# Patient Record
Sex: Male | Born: 1935 | Race: White | Hispanic: No | Marital: Married | Smoking: Former smoker
Health system: Southern US, Community
[De-identification: ages and names within clinical notes are randomized; demographics above are authoritative.]

## PROBLEM LIST (undated history)

## (undated) DIAGNOSIS — I10 Essential (primary) hypertension: Secondary | ICD-10-CM

## (undated) DIAGNOSIS — F0390 Unspecified dementia without behavioral disturbance: Secondary | ICD-10-CM

## (undated) DIAGNOSIS — E785 Hyperlipidemia, unspecified: Secondary | ICD-10-CM

## (undated) HISTORY — PX: CARDIAC SURGERY: SHX584

---

## 2005-12-30 ENCOUNTER — Encounter (HOSPITAL_COMMUNITY): Admission: RE | Admit: 2005-12-30 | Discharge: 2006-02-14 | Payer: Self-pay | Admitting: Urology

## 2006-03-31 ENCOUNTER — Ambulatory Visit (HOSPITAL_COMMUNITY): Admission: RE | Admit: 2006-03-31 | Discharge: 2006-03-31 | Payer: Self-pay | Admitting: Urology

## 2008-03-06 ENCOUNTER — Ambulatory Visit: Payer: Self-pay | Admitting: Unknown Physician Specialty

## 2011-02-17 ENCOUNTER — Ambulatory Visit: Payer: Self-pay | Admitting: Endocrinology

## 2015-01-27 DIAGNOSIS — E039 Hypothyroidism, unspecified: Secondary | ICD-10-CM | POA: Diagnosis not present

## 2015-01-27 DIAGNOSIS — I10 Essential (primary) hypertension: Secondary | ICD-10-CM | POA: Diagnosis not present

## 2015-01-27 DIAGNOSIS — C44629 Squamous cell carcinoma of skin of left upper limb, including shoulder: Secondary | ICD-10-CM | POA: Diagnosis not present

## 2015-01-27 DIAGNOSIS — E118 Type 2 diabetes mellitus with unspecified complications: Secondary | ICD-10-CM | POA: Diagnosis not present

## 2015-01-27 DIAGNOSIS — E780 Pure hypercholesterolemia: Secondary | ICD-10-CM | POA: Diagnosis not present

## 2015-01-27 DIAGNOSIS — C4499 Other specified malignant neoplasm of skin, unspecified: Secondary | ICD-10-CM | POA: Diagnosis not present

## 2015-05-07 DIAGNOSIS — E780 Pure hypercholesterolemia: Secondary | ICD-10-CM | POA: Diagnosis not present

## 2015-05-07 DIAGNOSIS — G309 Alzheimer's disease, unspecified: Secondary | ICD-10-CM | POA: Diagnosis not present

## 2015-05-07 DIAGNOSIS — E118 Type 2 diabetes mellitus with unspecified complications: Secondary | ICD-10-CM | POA: Diagnosis not present

## 2015-05-14 DIAGNOSIS — C44629 Squamous cell carcinoma of skin of left upper limb, including shoulder: Secondary | ICD-10-CM | POA: Diagnosis not present

## 2015-05-14 DIAGNOSIS — E039 Hypothyroidism, unspecified: Secondary | ICD-10-CM | POA: Diagnosis not present

## 2015-05-14 DIAGNOSIS — C4499 Other specified malignant neoplasm of skin, unspecified: Secondary | ICD-10-CM | POA: Diagnosis not present

## 2015-05-14 DIAGNOSIS — E780 Pure hypercholesterolemia: Secondary | ICD-10-CM | POA: Diagnosis not present

## 2015-05-14 DIAGNOSIS — G309 Alzheimer's disease, unspecified: Secondary | ICD-10-CM | POA: Diagnosis not present

## 2015-05-15 DIAGNOSIS — C44729 Squamous cell carcinoma of skin of left lower limb, including hip: Secondary | ICD-10-CM | POA: Diagnosis not present

## 2015-05-15 DIAGNOSIS — C4499 Other specified malignant neoplasm of skin, unspecified: Secondary | ICD-10-CM | POA: Diagnosis not present

## 2015-06-17 DIAGNOSIS — C4499 Other specified malignant neoplasm of skin, unspecified: Secondary | ICD-10-CM | POA: Diagnosis not present

## 2015-06-17 DIAGNOSIS — E780 Pure hypercholesterolemia: Secondary | ICD-10-CM | POA: Diagnosis not present

## 2015-06-17 DIAGNOSIS — I10 Essential (primary) hypertension: Secondary | ICD-10-CM | POA: Diagnosis not present

## 2015-06-17 DIAGNOSIS — E039 Hypothyroidism, unspecified: Secondary | ICD-10-CM | POA: Diagnosis not present

## 2015-06-17 DIAGNOSIS — C44629 Squamous cell carcinoma of skin of left upper limb, including shoulder: Secondary | ICD-10-CM | POA: Diagnosis not present

## 2015-06-17 DIAGNOSIS — L905 Scar conditions and fibrosis of skin: Secondary | ICD-10-CM | POA: Diagnosis not present

## 2015-06-29 DIAGNOSIS — D075 Carcinoma in situ of prostate: Secondary | ICD-10-CM | POA: Diagnosis not present

## 2015-06-29 DIAGNOSIS — E780 Pure hypercholesterolemia: Secondary | ICD-10-CM | POA: Diagnosis not present

## 2015-06-29 DIAGNOSIS — E039 Hypothyroidism, unspecified: Secondary | ICD-10-CM | POA: Diagnosis not present

## 2015-06-29 DIAGNOSIS — I10 Essential (primary) hypertension: Secondary | ICD-10-CM | POA: Diagnosis not present

## 2015-07-31 DIAGNOSIS — Z1211 Encounter for screening for malignant neoplasm of colon: Secondary | ICD-10-CM | POA: Diagnosis not present

## 2015-07-31 DIAGNOSIS — Z125 Encounter for screening for malignant neoplasm of prostate: Secondary | ICD-10-CM | POA: Diagnosis not present

## 2015-07-31 DIAGNOSIS — Z23 Encounter for immunization: Secondary | ICD-10-CM | POA: Diagnosis not present

## 2015-07-31 DIAGNOSIS — K224 Dyskinesia of esophagus: Secondary | ICD-10-CM | POA: Diagnosis not present

## 2015-07-31 DIAGNOSIS — Z0000 Encounter for general adult medical examination without abnormal findings: Secondary | ICD-10-CM | POA: Diagnosis not present

## 2015-07-31 DIAGNOSIS — G309 Alzheimer's disease, unspecified: Secondary | ICD-10-CM | POA: Diagnosis not present

## 2015-08-07 DIAGNOSIS — E780 Pure hypercholesterolemia: Secondary | ICD-10-CM | POA: Diagnosis not present

## 2015-08-14 DIAGNOSIS — E7800 Pure hypercholesterolemia, unspecified: Secondary | ICD-10-CM | POA: Diagnosis not present

## 2015-08-14 DIAGNOSIS — E118 Type 2 diabetes mellitus with unspecified complications: Secondary | ICD-10-CM | POA: Diagnosis not present

## 2015-08-14 DIAGNOSIS — G309 Alzheimer's disease, unspecified: Secondary | ICD-10-CM | POA: Diagnosis not present

## 2015-08-14 DIAGNOSIS — D075 Carcinoma in situ of prostate: Secondary | ICD-10-CM | POA: Diagnosis not present

## 2015-08-14 DIAGNOSIS — I10 Essential (primary) hypertension: Secondary | ICD-10-CM | POA: Diagnosis not present

## 2015-11-13 DIAGNOSIS — D075 Carcinoma in situ of prostate: Secondary | ICD-10-CM | POA: Diagnosis not present

## 2015-11-13 DIAGNOSIS — G309 Alzheimer's disease, unspecified: Secondary | ICD-10-CM | POA: Diagnosis not present

## 2015-11-13 DIAGNOSIS — E118 Type 2 diabetes mellitus with unspecified complications: Secondary | ICD-10-CM | POA: Diagnosis not present

## 2015-11-13 DIAGNOSIS — E785 Hyperlipidemia, unspecified: Secondary | ICD-10-CM | POA: Diagnosis not present

## 2015-11-13 DIAGNOSIS — E039 Hypothyroidism, unspecified: Secondary | ICD-10-CM | POA: Diagnosis not present

## 2015-11-20 DIAGNOSIS — G309 Alzheimer's disease, unspecified: Secondary | ICD-10-CM | POA: Diagnosis not present

## 2015-11-20 DIAGNOSIS — I2510 Atherosclerotic heart disease of native coronary artery without angina pectoris: Secondary | ICD-10-CM | POA: Diagnosis not present

## 2015-11-20 DIAGNOSIS — I10 Essential (primary) hypertension: Secondary | ICD-10-CM | POA: Diagnosis not present

## 2015-11-20 DIAGNOSIS — E039 Hypothyroidism, unspecified: Secondary | ICD-10-CM | POA: Diagnosis not present

## 2015-12-03 DIAGNOSIS — I2510 Atherosclerotic heart disease of native coronary artery without angina pectoris: Secondary | ICD-10-CM | POA: Diagnosis not present

## 2015-12-03 DIAGNOSIS — L570 Actinic keratosis: Secondary | ICD-10-CM | POA: Diagnosis not present

## 2015-12-03 DIAGNOSIS — C4499 Other specified malignant neoplasm of skin, unspecified: Secondary | ICD-10-CM | POA: Diagnosis not present

## 2015-12-03 DIAGNOSIS — G309 Alzheimer's disease, unspecified: Secondary | ICD-10-CM | POA: Diagnosis not present

## 2015-12-03 DIAGNOSIS — D2321 Other benign neoplasm of skin of right ear and external auricular canal: Secondary | ICD-10-CM | POA: Diagnosis not present

## 2015-12-03 DIAGNOSIS — E039 Hypothyroidism, unspecified: Secondary | ICD-10-CM | POA: Diagnosis not present

## 2015-12-10 DIAGNOSIS — G309 Alzheimer's disease, unspecified: Secondary | ICD-10-CM | POA: Diagnosis not present

## 2015-12-10 DIAGNOSIS — I2510 Atherosclerotic heart disease of native coronary artery without angina pectoris: Secondary | ICD-10-CM | POA: Diagnosis not present

## 2015-12-10 DIAGNOSIS — D075 Carcinoma in situ of prostate: Secondary | ICD-10-CM | POA: Diagnosis not present

## 2015-12-10 DIAGNOSIS — E039 Hypothyroidism, unspecified: Secondary | ICD-10-CM | POA: Diagnosis not present

## 2016-03-01 DIAGNOSIS — G309 Alzheimer's disease, unspecified: Secondary | ICD-10-CM | POA: Diagnosis not present

## 2016-03-01 DIAGNOSIS — I2510 Atherosclerotic heart disease of native coronary artery without angina pectoris: Secondary | ICD-10-CM | POA: Diagnosis not present

## 2016-03-01 DIAGNOSIS — E039 Hypothyroidism, unspecified: Secondary | ICD-10-CM | POA: Diagnosis not present

## 2016-03-01 DIAGNOSIS — D075 Carcinoma in situ of prostate: Secondary | ICD-10-CM | POA: Diagnosis not present

## 2016-03-01 DIAGNOSIS — M150 Primary generalized (osteo)arthritis: Secondary | ICD-10-CM | POA: Diagnosis not present

## 2016-03-30 DIAGNOSIS — I2510 Atherosclerotic heart disease of native coronary artery without angina pectoris: Secondary | ICD-10-CM | POA: Diagnosis not present

## 2016-03-30 DIAGNOSIS — G309 Alzheimer's disease, unspecified: Secondary | ICD-10-CM | POA: Diagnosis not present

## 2016-03-30 DIAGNOSIS — E1165 Type 2 diabetes mellitus with hyperglycemia: Secondary | ICD-10-CM | POA: Diagnosis not present

## 2016-03-30 DIAGNOSIS — D075 Carcinoma in situ of prostate: Secondary | ICD-10-CM | POA: Diagnosis not present

## 2016-03-30 DIAGNOSIS — I10 Essential (primary) hypertension: Secondary | ICD-10-CM | POA: Diagnosis not present

## 2016-03-30 DIAGNOSIS — M150 Primary generalized (osteo)arthritis: Secondary | ICD-10-CM | POA: Diagnosis not present

## 2016-05-06 DIAGNOSIS — C44222 Squamous cell carcinoma of skin of right ear and external auricular canal: Secondary | ICD-10-CM | POA: Diagnosis not present

## 2016-05-06 DIAGNOSIS — D075 Carcinoma in situ of prostate: Secondary | ICD-10-CM | POA: Diagnosis not present

## 2016-05-06 DIAGNOSIS — I4519 Other right bundle-branch block: Secondary | ICD-10-CM | POA: Diagnosis not present

## 2016-05-06 DIAGNOSIS — C4499 Other specified malignant neoplasm of skin, unspecified: Secondary | ICD-10-CM | POA: Diagnosis not present

## 2016-05-06 DIAGNOSIS — C44212 Basal cell carcinoma of skin of right ear and external auricular canal: Secondary | ICD-10-CM | POA: Diagnosis not present

## 2016-05-24 DIAGNOSIS — C4499 Other specified malignant neoplasm of skin, unspecified: Secondary | ICD-10-CM | POA: Diagnosis not present

## 2016-05-24 DIAGNOSIS — E039 Hypothyroidism, unspecified: Secondary | ICD-10-CM | POA: Diagnosis not present

## 2016-05-24 DIAGNOSIS — I2510 Atherosclerotic heart disease of native coronary artery without angina pectoris: Secondary | ICD-10-CM | POA: Diagnosis not present

## 2016-05-24 DIAGNOSIS — D075 Carcinoma in situ of prostate: Secondary | ICD-10-CM | POA: Diagnosis not present

## 2016-05-24 DIAGNOSIS — I4519 Other right bundle-branch block: Secondary | ICD-10-CM | POA: Diagnosis not present

## 2016-07-22 DIAGNOSIS — E039 Hypothyroidism, unspecified: Secondary | ICD-10-CM | POA: Diagnosis not present

## 2016-07-22 DIAGNOSIS — Z23 Encounter for immunization: Secondary | ICD-10-CM | POA: Diagnosis not present

## 2016-07-22 DIAGNOSIS — E118 Type 2 diabetes mellitus with unspecified complications: Secondary | ICD-10-CM | POA: Diagnosis not present

## 2016-07-22 DIAGNOSIS — I4519 Other right bundle-branch block: Secondary | ICD-10-CM | POA: Diagnosis not present

## 2016-07-22 DIAGNOSIS — M150 Primary generalized (osteo)arthritis: Secondary | ICD-10-CM | POA: Diagnosis not present

## 2016-07-22 DIAGNOSIS — I2510 Atherosclerotic heart disease of native coronary artery without angina pectoris: Secondary | ICD-10-CM | POA: Diagnosis not present

## 2016-07-31 ENCOUNTER — Emergency Department
Admission: EM | Admit: 2016-07-31 | Discharge: 2016-07-31 | Disposition: A | Discharge status: Home / Self Care | Payer: Medicare Other | Attending: Emergency Medicine | Admitting: Emergency Medicine

## 2016-07-31 DIAGNOSIS — Z87891 Personal history of nicotine dependence: Secondary | ICD-10-CM | POA: Insufficient documentation

## 2016-07-31 DIAGNOSIS — I10 Essential (primary) hypertension: Secondary | ICD-10-CM | POA: Diagnosis not present

## 2016-07-31 DIAGNOSIS — B029 Zoster without complications: Secondary | ICD-10-CM | POA: Insufficient documentation

## 2016-07-31 DIAGNOSIS — R21 Rash and other nonspecific skin eruption: Secondary | ICD-10-CM | POA: Diagnosis present

## 2016-07-31 LAB — BASIC METABOLIC PANEL
ANION GAP: 7 (ref 5–15)
BUN: 20 mg/dL (ref 6–20)
CHLORIDE: 101 mmol/L (ref 101–111)
CO2: 26 mmol/L (ref 22–32)
Calcium: 9.3 mg/dL (ref 8.9–10.3)
Creatinine, Ser: 0.97 mg/dL (ref 0.61–1.24)
GFR calc Af Amer: >60 mL/min (ref >60)
GLUCOSE: 189 mg/dL — AB (ref 65–99)
POTASSIUM: 3.6 mmol/L (ref 3.5–5.1)
Sodium: 134 mmol/L — ABNORMAL LOW (ref 135–145)

## 2016-07-31 LAB — CBC WITH DIFFERENTIAL/PLATELET
BASOS ABS: 0 10*3/uL (ref 0–0.1)
Basophils Relative: 1 %
EOS PCT: 0 %
Eosinophils Absolute: 0 10*3/uL (ref 0–0.7)
HCT: 40.4 % (ref 40.0–52.0)
HEMOGLOBIN: 14 g/dL (ref 13.0–18.0)
LYMPHS ABS: 0.5 10*3/uL — AB (ref 1.0–3.6)
LYMPHS PCT: 6 %
MCH: 32.5 pg (ref 26.0–34.0)
MCHC: 34.5 g/dL (ref 32.0–36.0)
MCV: 94 fL (ref 80.0–100.0)
Monocytes Absolute: 0.8 10*3/uL (ref 0.2–1.0)
Monocytes Relative: 9 %
NEUTROS ABS: 7.5 10*3/uL — AB (ref 1.4–6.5)
NEUTROS PCT: 84 %
PLATELETS: 216 10*3/uL (ref 150–440)
RBC: 4.3 MIL/uL — AB (ref 4.40–5.90)
RDW: 13.4 % (ref 11.5–14.5)
WBC: 8.9 10*3/uL (ref 3.8–10.6)

## 2016-07-31 MED ORDER — VALACYCLOVIR HCL 500 MG PO TABS
1000.0000 mg | ORAL_TABLET | Freq: Once | ORAL | Status: AC
  Administered 2016-07-31: 1000 mg via ORAL

## 2016-07-31 MED ORDER — CEPHALEXIN 500 MG PO CAPS
500.0000 mg | ORAL_CAPSULE | Freq: Once | ORAL | Status: AC
  Administered 2016-07-31: 500 mg via ORAL

## 2016-07-31 MED ORDER — TRIFLURIDINE 1 % OP SOLN: 1.0000 [drp] | OPHTHALMIC | 0 refills | Status: AC

## 2016-07-31 MED ORDER — VALACYCLOVIR HCL 500 MG PO TABS
ORAL_TABLET | ORAL | Status: AC
  Administered 2016-07-31: 1000 mg via ORAL
  Filled 2016-07-31: qty 2

## 2016-07-31 MED ORDER — CEPHALEXIN 250 MG PO CAPS: 250.0000 mg | ORAL_CAPSULE | Freq: Four times a day (QID) | ORAL | 0 refills | Status: DC

## 2016-07-31 MED ORDER — TRAMADOL HCL 50 MG PO TABS: 50.0000 mg | ORAL_TABLET | Freq: Four times a day (QID) | ORAL | 0 refills | Status: DC | PRN

## 2016-07-31 MED ORDER — CEPHALEXIN 500 MG PO CAPS
ORAL_CAPSULE | ORAL | Status: AC
  Administered 2016-07-31: 500 mg via ORAL
  Filled 2016-07-31: qty 1

## 2016-07-31 MED ORDER — PREDNISONE 10 MG (21) PO TBPK: ORAL_TABLET | ORAL | 0 refills | Status: DC

## 2016-07-31 MED ORDER — OXYCODONE-ACETAMINOPHEN 5-325 MG PO TABS
1.0000 | ORAL_TABLET | Freq: Once | ORAL | Status: AC
  Administered 2016-07-31: 1 via ORAL

## 2016-07-31 MED ORDER — OXYCODONE-ACETAMINOPHEN 5-325 MG PO TABS
ORAL_TABLET | ORAL | Status: AC
  Administered 2016-07-31: 1 via ORAL
  Filled 2016-07-31: qty 1

## 2016-07-31 MED ORDER — VALACYCLOVIR HCL 1 G PO TABS: 1000.0000 mg | ORAL_TABLET | Freq: Three times a day (TID) | ORAL | 0 refills | Status: DC

## 2016-07-31 NOTE — ED Notes (Signed)
Around pts eye filled with fluid and swollen. Redness noted and pain reported by pt. Discharge in eye and crusted around eye lashes. Redness stretching back right side of pts face and head.

## 2016-07-31 NOTE — ED Provider Notes (Signed)
[facility] Emergency Department Provider Note        Time seen: ----------------------------------------- 10:41 PM on 07/31/2016 -----------------------------------------    I have reviewed the triage vital signs and the nursing notes.   HISTORY  Chief Complaint Headache    HPI Joshua Zamora is a 82 y.o. male who presents to ER for possible fall. Family reported redness to the forehead and swelling around the right eye and concern he may have fallen. Patient reports the area is tender to touch. Nursing home did not report any falls. Patient complains of pain to his forehead otherwise denies complaints.   No past medical history on file.  There are no active problems to display for this patient.   No past surgical history on file.  Allergies Review of patient's allergies indicates no known allergies.  Social History Social History  Substance Use Topics  . Smoking status: Not on file  . Smokeless tobacco: Not on file  . Alcohol use Not on file    Review of Systems Constitutional: Negative for fever. ENT: Positive for right periorbital swelling and erythema Cardiovascular: Negative for chest pain. Respiratory: Negative for shortness of breath. Gastrointestinal: Negative for abdominal pain, vomiting and diarrhea. Skin: Positive for right forehead rash Neurological: Negative for headaches, focal weakness or numbness.  10-point ROS otherwise negative.  ____________________________________________   PHYSICAL EXAM:  VITAL SIGNS: ED Triage Vitals [07/31/16 2055]  Enc Vitals Group     BP (!) 165/70     Pulse Rate 73     Resp 20     Temp 98.2 F (36.8 C)     Temp Source Oral     SpO2 98 %     Weight 140 lb (63.5 kg)     Height 5\' 10"  (1.778 m)     Head Circumference      Peak Flow      Pain Score      Pain Loc      Pain Edu?      Excl. in [place]?     Constitutional: Alert, No acute distress Eyes: Conjunctivae on the right are  injected. PERRL. Normal extraocular movements. Eye was examined using fluorescein and tetracaine without any dendritic pattern or corneal ulcer present. ENT   Head: Erythema and rash in a specific V1 distribution on the right   Nose: No congestion/rhinnorhea.   Mouth/Throat: Mucous membranes are moist.   Neck: No stridor. Cardiovascular: Normal rate, regular rhythm. No murmurs, rubs, or gallops. Respiratory: Normal respiratory effort without tachypnea nor retractions. Breath sounds are clear and equal bilaterally. No wheezes/rales/rhonchi. Musculoskeletal: Nontender with normal range of motion in all extremities. No lower extremity tenderness nor edema. Neurologic:  Normal speech and language. No gross focal neurologic deficits are appreciated.  Skin:  Right-sided V1 trigeminal nerve rash as dictated above. Psychiatric: Mood and affect are normal. Speech and behavior are normal.  ____________________________________________  ED COURSE:  Pertinent labs & imaging results that were available during my care of the patient were reviewed by me and considered in my medical decision making (see chart for details). Clinical Course  Patient with findings consistent with shingles. He will likely require oral and ophthalmic medications as he has conjunctival injection on the right. Fluorescein and tetracaine exam is normal.  Procedures ____________________________________________   LABS (pertinent positives/negatives)  Labs Reviewed  CBC WITH DIFFERENTIAL/PLATELET - Abnormal; Notable for the following:       Result Value   RBC 4.30 (*)    Neutro  Abs 7.5 (*)    Lymphs Abs 0.5 (*)    All other components within normal limits  BASIC METABOLIC PANEL - Abnormal; Notable for the following:    Sodium 134 (*)    Glucose, Bld 189 (*)    All other components within normal limits    ____________________________________________  FINAL ASSESSMENT AND PLAN  Shingles  Plan: Patient with  labs as dictated above. Blood work is less consistent with cellulitis and more consistent with shingles. He will be treated with oral medication and steroids as well as ophthalmic drops. I may add Keflex to cover for any cellulitis that is present. He is advised to have follow-up with ophthalmology in 2 days for recheck.   Earleen Newport, MD   Note: This dictation was prepared with Dragon dictation. Any transcriptional errors that result from this process are unintentional    Earleen Newport, MD 07/31/16 2248

## 2016-07-31 NOTE — ED Notes (Signed)

## 2016-07-31 NOTE — ED Triage Notes (Addendum)
Daughter brought patient because concerned about possible fall.  Reports noticed redness (rash) to right forehead and right eye and concerned might have fallen.  Reports tender to touch.

## 2016-08-01 DIAGNOSIS — E039 Hypothyroidism, unspecified: Secondary | ICD-10-CM | POA: Diagnosis not present

## 2016-08-01 DIAGNOSIS — B028 Zoster with other complications: Secondary | ICD-10-CM | POA: Diagnosis not present

## 2016-08-01 DIAGNOSIS — I4519 Other right bundle-branch block: Secondary | ICD-10-CM | POA: Diagnosis not present

## 2016-08-03 ENCOUNTER — Emergency Department: Payer: Medicare Other

## 2016-08-03 ENCOUNTER — Observation Stay
Admission: EM | Admit: 2016-08-03 | Discharge: 2016-08-08 | Disposition: A | Discharge status: Skilled Nursing Facility | Payer: Medicare Other | Attending: Internal Medicine | Admitting: Internal Medicine

## 2016-08-03 ENCOUNTER — Encounter: Payer: Self-pay | Admitting: Emergency Medicine

## 2016-08-03 DIAGNOSIS — E039 Hypothyroidism, unspecified: Secondary | ICD-10-CM | POA: Insufficient documentation

## 2016-08-03 DIAGNOSIS — R9431 Abnormal electrocardiogram [ECG] [EKG]: Secondary | ICD-10-CM | POA: Diagnosis not present

## 2016-08-03 DIAGNOSIS — I2510 Atherosclerotic heart disease of native coronary artery without angina pectoris: Secondary | ICD-10-CM | POA: Insufficient documentation

## 2016-08-03 DIAGNOSIS — R630 Anorexia: Secondary | ICD-10-CM | POA: Insufficient documentation

## 2016-08-03 DIAGNOSIS — K5900 Constipation, unspecified: Secondary | ICD-10-CM | POA: Diagnosis not present

## 2016-08-03 DIAGNOSIS — E876 Hypokalemia: Secondary | ICD-10-CM | POA: Insufficient documentation

## 2016-08-03 DIAGNOSIS — R930 Abnormal findings on diagnostic imaging of skull and head, not elsewhere classified: Secondary | ICD-10-CM | POA: Diagnosis not present

## 2016-08-03 DIAGNOSIS — M6281 Muscle weakness (generalized): Secondary | ICD-10-CM

## 2016-08-03 DIAGNOSIS — Z79899 Other long term (current) drug therapy: Secondary | ICD-10-CM | POA: Insufficient documentation

## 2016-08-03 DIAGNOSIS — Z87891 Personal history of nicotine dependence: Secondary | ICD-10-CM | POA: Diagnosis not present

## 2016-08-03 DIAGNOSIS — I10 Essential (primary) hypertension: Secondary | ICD-10-CM | POA: Diagnosis not present

## 2016-08-03 DIAGNOSIS — G92 Toxic encephalopathy: Principal | ICD-10-CM | POA: Insufficient documentation

## 2016-08-03 DIAGNOSIS — B029 Zoster without complications: Secondary | ICD-10-CM

## 2016-08-03 DIAGNOSIS — R627 Adult failure to thrive: Secondary | ICD-10-CM | POA: Insufficient documentation

## 2016-08-03 DIAGNOSIS — R262 Difficulty in walking, not elsewhere classified: Secondary | ICD-10-CM | POA: Diagnosis not present

## 2016-08-03 DIAGNOSIS — F0391 Unspecified dementia with behavioral disturbance: Secondary | ICD-10-CM | POA: Diagnosis not present

## 2016-08-03 DIAGNOSIS — R531 Weakness: Secondary | ICD-10-CM

## 2016-08-03 DIAGNOSIS — E871 Hypo-osmolality and hyponatremia: Secondary | ICD-10-CM | POA: Diagnosis not present

## 2016-08-03 DIAGNOSIS — T424X5A Adverse effect of benzodiazepines, initial encounter: Secondary | ICD-10-CM | POA: Insufficient documentation

## 2016-08-03 DIAGNOSIS — E785 Hyperlipidemia, unspecified: Secondary | ICD-10-CM | POA: Diagnosis not present

## 2016-08-03 HISTORY — DX: Unspecified dementia, unspecified severity, without behavioral disturbance, psychotic disturbance, mood disturbance, and anxiety: F03.90

## 2016-08-03 HISTORY — DX: Hyperlipidemia, unspecified: E78.5

## 2016-08-03 HISTORY — DX: Essential (primary) hypertension: I10

## 2016-08-03 LAB — URINALYSIS COMPLETE WITH MICROSCOPIC (ARMC ONLY)
BILIRUBIN URINE: NEGATIVE
Bacteria, UA: NONE SEEN
GLUCOSE, UA: NEGATIVE mg/dL
HGB URINE DIPSTICK: NEGATIVE
KETONES UR: NEGATIVE mg/dL
LEUKOCYTES UA: NEGATIVE
NITRITE: NEGATIVE
PH: 7 (ref 5.0–8.0)
Protein, ur: NEGATIVE mg/dL
SPECIFIC GRAVITY, URINE: 1.006 (ref 1.005–1.030)
Squamous Epithelial / LPF: NONE SEEN

## 2016-08-03 LAB — BASIC METABOLIC PANEL
Anion gap: 6 (ref 5–15)
BUN: 22 mg/dL — AB (ref 6–20)
CHLORIDE: 96 mmol/L — AB (ref 101–111)
CO2: 27 mmol/L (ref 22–32)
CREATININE: 0.87 mg/dL (ref 0.61–1.24)
Calcium: 9.1 mg/dL (ref 8.9–10.3)
Glucose, Bld: 112 mg/dL — ABNORMAL HIGH (ref 65–99)
POTASSIUM: 4.3 mmol/L (ref 3.5–5.1)
SODIUM: 129 mmol/L — AB (ref 135–145)

## 2016-08-03 LAB — CBC
HCT: 41.7 % (ref 40.0–52.0)
Hemoglobin: 14.4 g/dL (ref 13.0–18.0)
MCH: 32.2 pg (ref 26.0–34.0)
MCHC: 34.5 g/dL (ref 32.0–36.0)
MCV: 93.2 fL (ref 80.0–100.0)
PLATELETS: 240 10*3/uL (ref 150–440)
RBC: 4.47 MIL/uL (ref 4.40–5.90)
RDW: 13.5 % (ref 11.5–14.5)
WBC: 11.4 10*3/uL — AB (ref 3.8–10.6)

## 2016-08-03 LAB — TROPONIN I: Troponin I: <0.03 ng/mL (ref <0.03)

## 2016-08-03 MED ORDER — ZOLPIDEM TARTRATE 5 MG PO TABS: 5.0000 mg | ORAL_TABLET | Freq: Every evening | ORAL | Status: DC | PRN

## 2016-08-03 MED ORDER — SENNOSIDES-DOCUSATE SODIUM 8.6-50 MG PO TABS: 1.0000 | ORAL_TABLET | Freq: Every evening | ORAL | Status: DC | PRN

## 2016-08-03 MED ORDER — ONDANSETRON HCL 4 MG/2ML IJ SOLN: 4.0000 mg | Freq: Four times a day (QID) | INTRAMUSCULAR | Status: DC | PRN

## 2016-08-03 MED ORDER — ACETAMINOPHEN 325 MG PO TABS: 650.0000 mg | ORAL_TABLET | Freq: Four times a day (QID) | ORAL | Status: DC | PRN

## 2016-08-03 MED ORDER — SODIUM CHLORIDE 0.9 % IV SOLN
INTRAVENOUS | Status: DC
Start: 2016-08-03 — End: 2016-08-06
  Administered 2016-08-03: 75 mL/h via INTRAVENOUS
  Administered 2016-08-04 – 2016-08-05 (×3): via INTRAVENOUS
  Administered 2016-08-06: 75 mL/h via INTRAVENOUS

## 2016-08-03 MED ORDER — HYDROCODONE-ACETAMINOPHEN 5-325 MG PO TABS: 1.0000 | ORAL_TABLET | ORAL | Status: DC | PRN

## 2016-08-03 MED ORDER — TRIFLURIDINE 1 % OP SOLN
1.0000 [drp] | OPHTHALMIC | Status: DC
  Administered 2016-08-04 – 2016-08-08 (×27): 1 [drp] via OPHTHALMIC
  Filled 2016-08-03: qty 7.5

## 2016-08-03 MED ORDER — TRAMADOL HCL 50 MG PO TABS
50.0000 mg | ORAL_TABLET | Freq: Four times a day (QID) | ORAL | Status: DC | PRN
  Administered 2016-08-07: 50 mg via ORAL
  Filled 2016-08-03: qty 1

## 2016-08-03 MED ORDER — SODIUM CHLORIDE 0.9 % IV BOLUS (SEPSIS)
1000.0000 mL | Freq: Once | INTRAVENOUS | Status: AC
  Administered 2016-08-03: 1000 mL via INTRAVENOUS

## 2016-08-03 MED ORDER — VALACYCLOVIR HCL 500 MG PO TABS
1000.0000 mg | ORAL_TABLET | Freq: Three times a day (TID) | ORAL | Status: DC
  Administered 2016-08-04 – 2016-08-06 (×7): 1000 mg via ORAL
  Filled 2016-08-03 (×7): qty 2

## 2016-08-03 MED ORDER — ONDANSETRON HCL 4 MG PO TABS: 4.0000 mg | ORAL_TABLET | Freq: Four times a day (QID) | ORAL | Status: DC | PRN

## 2016-08-03 MED ORDER — MAGNESIUM CITRATE PO SOLN: 1.0000 | Freq: Once | ORAL | Status: DC | PRN

## 2016-08-03 MED ORDER — ENOXAPARIN SODIUM 40 MG/0.4ML ~~LOC~~ SOLN
40.0000 mg | Freq: Every day | SUBCUTANEOUS | Status: DC
  Administered 2016-08-04 – 2016-08-07 (×5): 40 mg via SUBCUTANEOUS
  Filled 2016-08-03 (×4): qty 0.4

## 2016-08-03 MED ORDER — BISACODYL 5 MG PO TBEC: 5.0000 mg | DELAYED_RELEASE_TABLET | Freq: Every day | ORAL | Status: DC | PRN

## 2016-08-03 MED ORDER — ACETAMINOPHEN 650 MG RE SUPP: 650.0000 mg | Freq: Four times a day (QID) | RECTAL | Status: DC | PRN

## 2016-08-03 MED ORDER — CEPHALEXIN 250 MG PO CAPS
250.0000 mg | ORAL_CAPSULE | Freq: Four times a day (QID) | ORAL | Status: DC
  Administered 2016-08-04 – 2016-08-05 (×6): 250 mg via ORAL
  Filled 2016-08-03 (×8): qty 1

## 2016-08-03 NOTE — ED Notes (Signed)
Patient transported to CT 

## 2016-08-03 NOTE — Progress Notes (Signed)
Pt was diagnosed with shingles last Sunday and sent home with antiviral medicines. Per report from ED nurse Charleen Kirks, pt has rashes on the chest and lower back. Confirmed with charge RN Olivia Mackie if pt needs to be on airborne precaution and be placed in a negative pressure room. Information relayed to [facility]. No need for pt to be on airborne precaution and negative pressure room per [facility]. Pt was received on the floor on contact precautions.

## 2016-08-03 NOTE — ED Triage Notes (Signed)
Per daughter pt was seen here on Sunday and treated for shingles, sent home on meds. Pt not eating  And daughter states is very weak to the point where he  Would not walk today.

## 2016-08-03 NOTE — ED Provider Notes (Signed)
[facility] Emergency Department Provider Note        Time seen: ----------------------------------------- 5:32 PM on 08/03/2016 -----------------------------------------    I have reviewed the triage vital signs and the nursing notes.   HISTORY  Chief Complaint Fatigue and Anorexia    HPI Joshua Zamora is a 82 y.o. male who presents the ER after he was recently treated for shingles. Patient was sent home on medications covering for shingles including antiviral medicine, steroids and eyedrops. Since discharge here he's not been eating or drinking very well. Daughter states he has been very weak to the point where he has difficulty walking. She states the skin looks improved, he has not had fevers chills or other complaints. Patient states "I don't feel too bad". Family is concerned about progressive weakness.   Past Medical History:  Diagnosis Date  . Dementia   . Hyperlipidemia   . Hypertension     There are no active problems to display for this patient.   Past Surgical History:  Procedure Laterality Date  . CARDIAC SURGERY      Allergies Review of patient's allergies indicates no known allergies.  Social History Social History  Substance Use Topics  . Smoking status: Former Research scientist (life sciences)  . Smokeless tobacco: Never Used  . Alcohol use Not on file    Review of Systems Constitutional: Negative for fever. Cardiovascular: Negative for chest pain. Respiratory: Negative for shortness of breath. Gastrointestinal: Negative for abdominal pain, vomiting and diarrhea. Genitourinary: Negative for dysuria. Musculoskeletal: Negative for back pain. Skin: Positive for right-sided rash on forehead. Neurological: Negative for headaches, Positive for generalized weakness  10-point ROS otherwise negative.  ____________________________________________   PHYSICAL EXAM:  VITAL SIGNS: ED Triage Vitals [08/03/16 1643]  Enc Vitals Group     BP 139/74      Pulse Rate 81     Resp 18     Temp 98.6 F (37 C)     Temp Source Oral     SpO2 96 %     Weight      Height      Head Circumference      Peak Flow      Pain Score      Pain Loc      Pain Edu?      Excl. in [place]?     Constitutional: Alert and oriented. Well appearing and in no distress. Eyes: Conjunctivae are normal. PERRL. Normal extraocular movements. ENT   Head: Normocephalic and atraumatic.Mild right supraorbital edema   Nose: No congestion/rhinnorhea.   Mouth/Throat: Mucous membranes are moist.   Neck: No stridor. Cardiovascular: Normal rate, regular rhythm. No murmurs, rubs, or gallops. Respiratory: Normal respiratory effort without tachypnea nor retractions. Breath sounds are clear and equal bilaterally. No wheezes/rales/rhonchi. Gastrointestinal: Soft and nontender. Normal bowel sounds Musculoskeletal: Nontender with normal range of motion in all extremities. No lower extremity tenderness nor edema. Neurologic:  Normal speech and language. No gross focal neurologic deficits are appreciated. Generalized weakness, nothing focal Skin:  Erythema in a V1 distribution particularly in the right frontal scalp Psychiatric: Mood and affect are normal. Speech and behavior are normal.  ____________________________________________  EKG: Interpreted by me. Sinus rhythm with a rate of 70 bpm, normal PR interval, wide QRS, normal QT interval, right bundle branch block, left axis deviation  ____________________________________________  ED COURSE:  Pertinent labs & imaging results that were available during my care of the patient were reviewed by me and considered in my medical decision making (  see chart for details). Clinical Course  Patient presents to ER for progressive weakness of uncertain etiology. We will assess with basic labs and give IV fluid.  Procedures ____________________________________________   LABS (pertinent positives/negatives)  Labs Reviewed   BASIC METABOLIC PANEL - Abnormal; Notable for the following:       Result Value   Sodium 129 (*)    Chloride 96 (*)    Glucose, Bld 112 (*)    BUN 22 (*)    All other components within normal limits  CBC - Abnormal; Notable for the following:    WBC 11.4 (*)    All other components within normal limits  TROPONIN I  URINALYSIS COMPLETEWITH MICROSCOPIC ([facility] ONLY)  CBG MONITORING, ED    RADIOLOGY Images were viewed by me  IMPRESSION: 1. No acute intracranial abnormalities. 2. Age related volume loss. Moderate chronic microvascular ischemic change. Chest x-ray is unremarkable  ____________________________________________  FINAL ASSESSMENT AND PLAN  Weakness, Hyponatremia  Plan: Patient with labs and imaging as dictated above. No clear etiology for his symptoms. He has been treated for shingles, it is unclear if the medications have caused his weakness. He is generally weak and has had poor by mouth intake. He is receiving IV fluids, family is requesting hospital observation.   Earleen Newport, MD   Note: This dictation was prepared with Dragon dictation. Any transcriptional errors that result from this process are unintentional    Earleen Newport, MD 08/03/16 [phone]

## 2016-08-03 NOTE — ED Notes (Signed)
Report given to Kuch, RN.  

## 2016-08-03 NOTE — ED Notes (Addendum)
Pt stating, " I dont feel to bad." Per pt's family though pt has become decreasingly weak since he was last seen on Sunday for shingles dx. Pt appears sleepy at this time. Pt was a heavy assist from wheelchair to stretcher. Pt not able to support body weight. Per pt's family pt's shingles have improved. Pt had episodes of n/v on Monday and Tuesday and has had a decreased appetite since then.

## 2016-08-03 NOTE — ED Notes (Signed)
Pt has not been able to void yet. Pt is attempting now. Pt is much more awake at this time. Pt has received almost 1L of Ns. Fluids are running good into IV site. Pt's family informed that pt would be going for CT scan.

## 2016-08-04 DIAGNOSIS — R627 Adult failure to thrive: Secondary | ICD-10-CM | POA: Diagnosis not present

## 2016-08-04 DIAGNOSIS — E871 Hypo-osmolality and hyponatremia: Secondary | ICD-10-CM | POA: Diagnosis not present

## 2016-08-04 DIAGNOSIS — B029 Zoster without complications: Secondary | ICD-10-CM | POA: Diagnosis not present

## 2016-08-04 DIAGNOSIS — R531 Weakness: Secondary | ICD-10-CM | POA: Diagnosis not present

## 2016-08-04 LAB — PHOSPHORUS: Phosphorus: 2.5 mg/dL (ref 2.5–4.6)

## 2016-08-04 LAB — BASIC METABOLIC PANEL
Anion gap: 6 (ref 5–15)
BUN: 20 mg/dL (ref 6–20)
CALCIUM: 8.8 mg/dL — AB (ref 8.9–10.3)
CHLORIDE: 100 mmol/L — AB (ref 101–111)
CO2: 27 mmol/L (ref 22–32)
CREATININE: 0.66 mg/dL (ref 0.61–1.24)
GFR calc non Af Amer: >60 mL/min (ref >60)
Glucose, Bld: 118 mg/dL — ABNORMAL HIGH (ref 65–99)
Potassium: 3.9 mmol/L (ref 3.5–5.1)
SODIUM: 133 mmol/L — AB (ref 135–145)

## 2016-08-04 LAB — CBC
HEMATOCRIT: 42.2 % (ref 40.0–52.0)
HEMOGLOBIN: 14.4 g/dL (ref 13.0–18.0)
MCH: 32.3 pg (ref 26.0–34.0)
MCHC: 34.2 g/dL (ref 32.0–36.0)
MCV: 94.6 fL (ref 80.0–100.0)
Platelets: 230 10*3/uL (ref 150–440)
RBC: 4.46 MIL/uL (ref 4.40–5.90)
RDW: 13.5 % (ref 11.5–14.5)
WBC: 12 10*3/uL — ABNORMAL HIGH (ref 3.8–10.6)

## 2016-08-04 LAB — MAGNESIUM: Magnesium: 2.1 mg/dL (ref 1.7–2.4)

## 2016-08-04 MED ORDER — MEMANTINE HCL ER 14 MG PO CP24
28.0000 mg | ORAL_CAPSULE | Freq: Every day | ORAL | Status: DC
  Administered 2016-08-05 – 2016-08-08 (×4): 28 mg via ORAL
  Filled 2016-08-04 (×4): qty 2

## 2016-08-04 MED ORDER — DONEPEZIL HCL 5 MG PO TABS
5.0000 mg | ORAL_TABLET | Freq: Every day | ORAL | Status: DC
  Administered 2016-08-04 – 2016-08-07 (×4): 5 mg via ORAL
  Filled 2016-08-04 (×3): qty 1

## 2016-08-04 MED ORDER — HYDRALAZINE HCL 20 MG/ML IJ SOLN
10.0000 mg | Freq: Four times a day (QID) | INTRAMUSCULAR | Status: DC | PRN
Start: 2016-08-04 — End: 2016-08-08

## 2016-08-04 MED ORDER — LORAZEPAM 2 MG/ML IJ SOLN
INTRAMUSCULAR | Status: AC
  Filled 2016-08-04: qty 1

## 2016-08-04 MED ORDER — LOSARTAN POTASSIUM 50 MG PO TABS
50.0000 mg | ORAL_TABLET | Freq: Every day | ORAL | Status: DC
  Administered 2016-08-04 – 2016-08-08 (×5): 50 mg via ORAL
  Filled 2016-08-04 (×6): qty 1

## 2016-08-04 MED ORDER — LORAZEPAM 2 MG/ML IJ SOLN
0.2500 mg | Freq: Once | INTRAMUSCULAR | Status: AC
  Administered 2016-08-04: 0.25 mg via INTRAVENOUS

## 2016-08-04 MED ORDER — HYDRALAZINE HCL 20 MG/ML IJ SOLN
10.0000 mg | Freq: Once | INTRAMUSCULAR | Status: AC
Start: 2016-08-04 — End: 2016-08-04
  Administered 2016-08-04: 10 mg via INTRAVENOUS
  Filled 2016-08-04: qty 1

## 2016-08-04 MED ORDER — ATENOLOL 25 MG PO TABS
25.0000 mg | ORAL_TABLET | Freq: Every day | ORAL | Status: DC
  Administered 2016-08-04 – 2016-08-08 (×5): 25 mg via ORAL
  Filled 2016-08-04 (×5): qty 1

## 2016-08-04 MED ORDER — LOSARTAN POTASSIUM-HCTZ 50-12.5 MG PO TABS: 1.0000 | ORAL_TABLET | Freq: Every day | ORAL | Status: DC

## 2016-08-04 MED ORDER — HYDROCHLOROTHIAZIDE 12.5 MG PO CAPS
12.5000 mg | ORAL_CAPSULE | Freq: Every day | ORAL | Status: DC
  Administered 2016-08-04 – 2016-08-08 (×5): 12.5 mg via ORAL
  Filled 2016-08-04 (×5): qty 1

## 2016-08-04 MED ORDER — LEVOTHYROXINE SODIUM 25 MCG PO TABS
25.0000 ug | ORAL_TABLET | Freq: Every day | ORAL | Status: DC
  Administered 2016-08-05 – 2016-08-08 (×4): 25 ug via ORAL
  Filled 2016-08-04 (×4): qty 1

## 2016-08-04 MED ORDER — LORAZEPAM 2 MG/ML IJ SOLN
0.2500 mg | Freq: Once | INTRAMUSCULAR | Status: AC
  Administered 2016-08-04: 0.25 mg via INTRAVENOUS
  Filled 2016-08-04: qty 1

## 2016-08-04 MED ORDER — HALOPERIDOL LACTATE 5 MG/ML IJ SOLN: 2.5000 mg | Freq: Four times a day (QID) | INTRAMUSCULAR | Status: DC | PRN

## 2016-08-04 MED ORDER — ATORVASTATIN CALCIUM 20 MG PO TABS
40.0000 mg | ORAL_TABLET | Freq: Every evening | ORAL | Status: DC
  Administered 2016-08-04 – 2016-08-07 (×4): 40 mg via ORAL
  Filled 2016-08-04 (×4): qty 2

## 2016-08-04 NOTE — Progress Notes (Signed)
Pt. Agitated and trying to get out of bed. Ativan 0.25mg  IV ordered per Dr. Estanislado Pandy.

## 2016-08-04 NOTE — Progress Notes (Signed)
AC Stephanie seen on the floor tonight, asked her opinion about this pt coming with shingles diagnosed last Sunday. Per [facility], pt needs to be on airborne precaution and in a negative pressure room. Awaiting for confirmation from [facility] if pt will be admitted on the floor.

## 2016-08-04 NOTE — Progress Notes (Addendum)
New Admission Note:   Arrival Method: per stretcher from ED, pt came from home Mental Orientation: alert and oriented to self Telemetry: placed on telebox [id number], CCMD notified, verified with RN Olivia Mackie Assessment: Completed Skin: warm, dry with redness noted around the right eye, with some rashes noted on the upper mid chest near the neck with one very small open lesion. Pt was diagnosed with shingles last Sunday. Prophylactic sacral foam dressing applied. IV: G18 on the right AC with transparent dressing, intact Pain: pt denies being in pain as of this time Safety Measures: Safety Fall Prevention Plan has been given, pt needs reinforcement Admission: not completed at this time, pt unable to answer most of the questions (pls refer to mental orientation) 1A Orientation: Patient is alert and oriented to self Family: no family member present at bedside  Orders have been reviewed and implemented. Will continue to monitor the patient. Call light has been placed within reach and bed alarm has been activated.   Georgeanna Harrison BSN, RN [facility] 1A

## 2016-08-04 NOTE — Progress Notes (Signed)
PT Cancellation Note  Patient Details Name: Joshua Zamora MRN: [id number] DOB: [birth year]   Cancelled Treatment:    Reason Eval/Treat Not Completed: Other (comment). Evaluation attempted this afternoon. Pt now in airborne isolation for shingles. Pt very lethargic, unable to wake up for therapist or sitter in room. Occasionally, pt spontaneously would move all extremities pulling at covers and moan, however never able to open eyes to participate. Will re-attempt next date if appropriate.    Elner Seifert 08/04/2016, 2:39 PM  Greggory Stallion, PT, DPT [phone]

## 2016-08-04 NOTE — Progress Notes (Signed)
Patient's appetite remains poor.

## 2016-08-04 NOTE — Care Management Obs Status (Signed)
[facility] NOTIFICATION   Patient Details  Name: Joshua Zamora MRN: [id number] Date of Birth: [birth year]   Medicare Observation Status Notification Given:  Other (see comment) Explained to daughter on the phone    Jolly Mango, RN 08/04/2016, 9:46 AM

## 2016-08-04 NOTE — Clinical Social Work Note (Signed)
Clinical Social Work Assessment  Patient Details  Name: Joshua Zamora MRN: [id number] Date of Birth: 1918-12-27  Date of referral:  08/04/16               Reason for consult:  Facility Placement                Permission sought to share information with:  Chartered certified accountant granted to share information::  Yes, Verbal Permission Granted  Name::      [place]::   [place]   Relationship::     Contact Information:     Housing/Transportation Living arrangements for the past 2 months:  [place] of Information:  [place], Adult Children Patient Interpreter Needed:  None Criminal Activity/Legal Involvement Pertinent to Current Situation/Hospitalization:  No - Comment as needed Significant Relationships:  Adult Children Lives with:  Self, Facility Resident Do you feel safe going back to the place where you live?    Need for family participation in patient care:  Yes (Comment)  Care giving concerns:  Patient lives at [facility] in [place].    Social Worker assessment / plan:  Holiday representative (CSW) received verbal consult from RN case manager that patient's daughter Gay Filler is interested in placement. CSW contacted patient's daughter Gay Filler to discuss D/C plan. Per daughter she is patient's HPOA and moved to [place] from [place] to be closer to her father. Daughter reported that patient was been getting weaker and she was looking into [facility] respite and memory care. Per daughter patient can pay privately for ALF. CSW explained that patient can go to SNF for short term rehab from the hospital under his [facility]. Daughter is agreeable to SNF search in [place]. Per daughter the plan is to get patient to rehab and then transition him to a memory care unit under private pay. Daughter reported that patient has been very active for most of his life and golfed 3 times a week in  retirement. Daughter became tearful and thanked CSW for assistance. CSW provided emotional support.   FL2 complete and faxed out. CSW will continue to follow and assist as needed.     Employment status:  Retired Nurse, adult PT Recommendations:  Not assessed at this time Information / Referral to community resources:  [place]  Patient/Family's Response to care: Patient's daughter is agreeable to [employer].   Patient/Family's Understanding of and Emotional Response to Diagnosis, Current Treatment, and Prognosis:  Patient's daughter was very pleasant and thanked CSW for visit.   Emotional Assessment Appearance:  Appears stated age Attitude/Demeanor/Rapport:  Unable to Assess Affect (typically observed):  Unable to Assess Orientation:  Oriented to Self, Fluctuating Orientation (Suspected and/or reported Sundowners) Alcohol / Substance use:  Not Applicable Psych involvement (Current and /or in the community):  No (Comment)  Discharge Needs  Concerns to be addressed:  Discharge Planning Concerns Readmission within the last 30 days:  No Current discharge risk:  Chronically ill, Cognitively Impaired, Dependent with Mobility Barriers to Discharge:  Continued Medical Work up   [employer], Veronia Beets, LCSW 08/04/2016, 2:50 PM

## 2016-08-04 NOTE — Progress Notes (Signed)
SNF and Non-Emergent EMS Transport Benefits:  Number called: [phone] Rep: Geryl Rankins  Reference Number: [id number] Medicare Complete HMO Plan One active as of 11/08/15 with no deductible.  Out of pocket max is $4900, of which $70 met so far.  In-network SNF: $0 copay for days 1-20, a $160 daily copay for days 21-51, and a $0 copay for days 52-100.  Once out of pocket is reached, patient covered at 100% for remainder of 100 day benefit period.  $0 copay for professional fees and 3 day hospital stay is not required.  Josem Kaufmann is required: [phone].    Non-emergent EMS transport: $250 copay for each one way medically necessary, Medicare covered trip.  Josem Kaufmann is required: [phone].

## 2016-08-04 NOTE — Progress Notes (Signed)
Explained to daughter on the phone

## 2016-08-04 NOTE — Progress Notes (Signed)
Pt's BP as of this time 193/83, 97% on room air. On call MD Hugelmyer paged, ordered for one time dose Hydralazine 10mg  IV. Will administer and continue to monitor.

## 2016-08-04 NOTE — Care Management (Signed)
Spoke with daughter,Sallie Allred [phone]). She is interested inn SNF placement. CSW to speak with daughter further

## 2016-08-04 NOTE — Progress Notes (Signed)
[facility] [place] NAME: Joshua Zamora    MR#:  [id number]  DATE OF BIRTH:  10-16-1919  SUBJECTIVE:   Pt. Here due to weaknessAnd poor by mouth intake and difficulty walking. Patient was agitated overnight and received some Ativan and is encephalopathic presently.  REVIEW OF SYSTEMS:    Review of Systems  Unable to perform ROS: Dementia    Nutrition: NPO Tolerating Diet: No Tolerating PT: Await Eval.    DRUG ALLERGIES:  No Known Allergies  VITALS:  Blood pressure (!) 164/98, pulse 84, temperature 97.5 F (36.4 C), temperature source Oral, resp. rate 18, height 5\' 10"  (1.778 m), weight 62.7 kg (138 lb 4.8 oz), SpO2 98 %.  PHYSICAL EXAMINATION:   Physical Exam  GENERAL:  82 y.o.-year-old patient lying in the bed lethargic/encephalopathic EYES: Pupils equal, round, reactive to light. No scleral icterus. Extraocular muscles intact.  HEENT: Head atraumatic, normocephalic. Oropharynx and nasopharynx clear.  NECK:  Supple, no jugular venous distention. No thyroid enlargement, no tenderness.  LUNGS: Normal breath sounds bilaterally, no wheezing, rales, rhonchi. No use of accessory muscles of respiration.  CARDIOVASCULAR: S1, S2 normal. No murmurs, rubs, or gallops.  ABDOMEN: Soft, nontender, nondistended. Bowel sounds present. No organomegaly or mass.  EXTREMITIES: No cyanosis, clubbing or edema b/l.    NEUROLOGIC: Difficult to do a full neurological exam is patient is encephalopathic. Moves all extremities spontaneously.  PSYCHIATRIC: The patient is alert and oriented x 1.  SKIN: No obvious rash, lesion, or ulcer.    LABORATORY PANEL:   CBC  Recent Labs Lab 08/04/16 0502  WBC 12.0*  HGB 14.4  HCT 42.2  PLT 230   ------------------------------------------------------------------------------------------------------------------  Chemistries   Recent Labs Lab 08/04/16 0502  NA 133*  K 3.9  CL 100*  CO2 27  GLUCOSE  118*  BUN 20  CREATININE 0.66  CALCIUM 8.8*  MG 2.1   ------------------------------------------------------------------------------------------------------------------  Cardiac Enzymes  Recent Labs Lab 08/03/16 1705  TROPONINI <0.03   ------------------------------------------------------------------------------------------------------------------  RADIOLOGY:  Dg Chest 1 View  Result Date: 08/03/2016 CLINICAL DATA:  Weakness EXAM: CHEST 1 VIEW COMPARISON:  5/22/7 FINDINGS: Previous median sternotomy and CABG procedure. The heart size and mediastinal contours are within normal limits. Both lungs are clear. No pleural effusion or edema. The visualized skeletal structures are unremarkable. IMPRESSION: 1. No acute cardiopulmonary abnormalities Electronically Signed   By: Kerby Moors M.D.   On: 08/03/2016 19:22   Ct Head Wo Contrast  Result Date: 08/03/2016 CLINICAL DATA:  Per pt's family though pt has become decreasingly weak since he was last seen on Sunday for shingles dx. Pt appears sleepy at this time. Pt was a heavy assist from wheelchair to stretcher. Pt not able to support body weight. Per pt's family pt's shingles have improved. Pt had episodes of n/v on Monday and Tuesday and has had a decreased appetite since then. EXAM: CT HEAD WITHOUT CONTRAST TECHNIQUE: Contiguous axial images were obtained from the base of the skull through the vertex without intravenous contrast. COMPARISON:  None. FINDINGS: Brain: The ventricles are normal in size, for this patient's age, and normal in configuration. There are no parenchymal masses or mass effect. There is no evidence of a recent cortical infarct. Patchy areas of white matter hypoattenuation noted consistent with moderate chronic microvascular ischemic change. There are no extra-axial masses or abnormal fluid collections. There is no intracranial hemorrhage. Vascular: No hyperdense vessel or unexpected calcification. Skull: Normal. Negative  for fracture or  focal lesion. Sinuses/Orbits: No acute finding. Other: None IMPRESSION: 1. No acute intracranial abnormalities. 2. Age related volume loss. Moderate chronic microvascular ischemic change. Electronically Signed   By: Lajean Manes M.D.   On: 08/03/2016 18:50     ASSESSMENT AND PLAN:   82 year old male with past medical history of dementia, HTN, hyperlipidemia recently diagnosed shingles who presented to the hospital due to weakness and poor by mouth intake.  1. weakness/altered mental status-secondary to dementia combined with underlying encephalopathy secondary to benzodiazepines. - pt. Got some ativan last night and is lethargic/encephalopathic now.  - hold Benzo's.  PRN haldol for agitation.  Follow mental status.   2. Failure to thrive/weakness - cont. IV fluids.  - await PT eval.   3. Shingles - cont. Valtrex.    4. HTN - cannot take PO meds now.  - PRN IV hydralazine.   5. Dementia - cont. Aricept.  - PRN haldol for agitation.  Hold Benzo's.   6. Hypothyroidism - cont. Synthroid.    All the records are reviewed and case discussed with Care Management/Social Worker. Management plans discussed with the patient, family and they are in agreement.  CODE STATUS: Full Code  DVT Prophylaxis: Lovenox  TOTAL TIME TAKING CARE OF THIS PATIENT: 30 minutes.   POSSIBLE D/C IN 1-2 DAYS, DEPENDING ON CLINICAL CONDITION.   Henreitta Leber M.D on 08/04/2016 at 1:59 PM  Between 7am to 6pm - Pager - [phone]  After 6pm go to [email or web address] - Technical brewer  Hospitalists  Office  [phone]  CC: Primary care physician; No PCP Per Patient

## 2016-08-04 NOTE — Progress Notes (Signed)
Patient appeared a little more relaxed this pm, unable to verbalize anything this am. Spoke with daughter and stated that he is not at baseline. Dementia symptoms at baseline. Unable to give eye drops most of day b/c were unable to obtain. Later RX found eye drops in refrigerator.

## 2016-08-04 NOTE — Progress Notes (Signed)
Pt is alert and oriented to self at this time, starting to become increasingly agitated, getting out of bed and pulling off telemetry leads. On call MD Hugelmyer paged, ordered for one time dose of Ativan 0.25mg  IV. Will administer and continue to monitor.

## 2016-08-04 NOTE — Clinical Social Work Placement (Signed)
   CLINICAL SOCIAL WORK PLACEMENT  NOTE  Date:  08/04/2016  Patient Details  Name: EDDIE ILACQUA MRN: [id number] Date of Birth: Jan 02, 1919  Clinical Social Work is seeking post-discharge placement for this patient at the [place] level of care (*CSW will initial, date and re-position this form in  chart as items are completed):  Yes   Patient/family provided with [place] Work Department's list of facilities offering this level of care within the geographic area requested by the patient (or if unable, by the patient's family).  Yes   Patient/family informed of their freedom to choose among providers that offer the needed level of care, that participate in Medicare, Medicaid or managed care program needed by the patient, have an available bed and are willing to accept the patient.  Yes   Patient/family informed of [place]'s ownership interest in [facility] and [facility], as well as of the fact that they are under no obligation to receive care at these facilities.  PASRR submitted to EDS on 08/04/16     PASRR number received on 08/04/16     Existing PASRR number confirmed on       FL2 transmitted to all facilities in geographic area requested by pt/family on 08/04/16     FL2 transmitted to all facilities within larger geographic area on       Patient informed that his/her managed care company has contracts with or will negotiate with certain facilities, including the following:            Patient/family informed of bed offers received.  Patient chooses bed at       Physician recommends and patient chooses bed at      Patient to be transferred to   on  .  Patient to be transferred to facility by       Patient family notified on   of transfer.  Name of family member notified:        PHYSICIAN       Additional Comment:    _______________________________________________ Mikel Pyon, Veronia Beets, LCSW 08/04/2016, 2:48 PM

## 2016-08-04 NOTE — Progress Notes (Signed)
Pt is alert and oriented to self at this time, starting to become agitated and pulling off telemetry leads. Pt refused taking oral medicines and stated "I'm not taking any pill." Lovenox given [place] on the right lower abdomen. Eye drops not available as of this time per pharmacist Nate.

## 2016-08-04 NOTE — Progress Notes (Signed)
Clinical Education officer, museum (CSW) presented bed offers to patient's daughter Joshua Zamora. Per daughter she will review offers and get back to CSW. CSW will continue to follow and assist as needed.   [employer], LCSW [phone]

## 2016-08-04 NOTE — Progress Notes (Signed)
PT Cancellation Note  Patient Details Name: Joshua Zamora MRN: [id number] DOB: Jul 11, 1919   Cancelled Treatment:    Reason Eval/Treat Not Completed: Other (comment). Consult received and chart reviewed. Per RN, pt about to be moved to negative pressure isolation room secondary to shingles. In middle of transfer process. Will hold evaluation at this time until pt in isolation room. Will re-attempt.   Naleyah Ohlinger 08/04/2016, 9:23 AM Greggory Stallion, PT, DPT [phone]

## 2016-08-04 NOTE — NC FL2 (Signed)
[place] LEVEL OF CARE SCREENING TOOL     IDENTIFICATION  Patient Name: Joshua Zamora Birthdate: [birth year] Sex: male Admission Date (Current Location): 08/03/2016  [place] and [place] Number:  Engineering geologist and Address:  [facility], [address], [place], [place] [address]      Provider Number: [id number]  Attending Physician Name and Address:  Henreitta Leber, MD  Relative Name and Phone Number:       Current Level of Care: Hospital Recommended Level of Care: [place] Prior Approval Number:    Date Approved/Denied:   PASRR Number:  [id number])  Discharge Plan: SNF    Current Diagnoses: Patient Active Problem List   Diagnosis Date Noted  . Weakness 08/03/2016    Orientation RESPIRATION BLADDER Height & Weight     Self, Place  O2 (Nasal Cannula: 2L/min ) Continent Weight: 138 lb 4.8 oz (62.7 kg) Height:  5\' 10"  (177.8 cm)  BEHAVIORAL SYMPTOMS/MOOD NEUROLOGICAL BOWEL NUTRITION STATUS   (None. )  (None. ) Continent Diet (Diet: Heart )  AMBULATORY STATUS COMMUNICATION OF NEEDS Skin   Extensive Assist Verbally Normal                       Personal Care Assistance Level of Assistance  Bathing, Feeding, Dressing Bathing Assistance: Limited assistance Feeding assistance: Independent Dressing Assistance: Limited assistance     Functional Limitations Info  Sight, Hearing, Speech Sight Info: Impaired Hearing Info: Impaired Speech Info: Impaired (Dentures )    SPECIAL CARE FACTORS FREQUENCY  PT (By licensed PT), OT (By licensed OT)     PT Frequency:  (5) OT Frequency:  (5)            Contractures      Additional Factors Info  Code Status, Allergies, Isolation Precautions Code Status Info:  (Full Code ) Allergies Info:  (No Known Allergies )     Isolation Precautions Info:  (Patient has shingles. )     Current Medications (08/04/2016):  This is the current hospital active  medication list Current Facility-Administered Medications  Medication Dose Route Frequency Provider Last Rate Last Dose  . 0.9 %  sodium chloride infusion   Intravenous Continuous Alexis Hugelmeyer, DO 75 mL/hr at 08/03/16 2354 75 mL/hr at 08/03/16 2354  . acetaminophen (TYLENOL) tablet 650 mg  650 mg Oral Q6H PRN Alexis Hugelmeyer, DO       Or  . acetaminophen (TYLENOL) suppository 650 mg  650 mg Rectal Q6H PRN Alexis Hugelmeyer, DO      . atenolol (TENORMIN) tablet 25 mg  25 mg Oral Daily Alexis Hugelmeyer, DO      . atorvastatin (LIPITOR) tablet 40 mg  40 mg Oral QPM Alexis Hugelmeyer, DO      . bisacodyl (DULCOLAX) EC tablet 5 mg  5 mg Oral Daily PRN Alexis Hugelmeyer, DO      . cephALEXin (KEFLEX) capsule 250 mg  250 mg Oral QID Alexis Hugelmeyer, DO      . donepezil (ARICEPT) tablet 5 mg  5 mg Oral QHS Alexis Hugelmeyer, DO      . enoxaparin (LOVENOX) injection 40 mg  40 mg Subcutaneous QHS Alexis Hugelmeyer, DO   40 mg at 08/04/16 0059  . haloperidol lactate (HALDOL) injection 2.5 mg  2.5 mg Intravenous Q6H PRN Henreitta Leber, MD      . losartan (COZAAR) tablet 50 mg  50 mg Oral Daily [employer], DO  And  . hydrochlorothiazide (MICROZIDE) capsule 12.5 mg  12.5 mg Oral Daily Alexis Hugelmeyer, DO      . HYDROcodone-acetaminophen (NORCO/VICODIN) 5-325 MG per tablet 1-2 tablet  1-2 tablet Oral Q4H PRN Alexis Hugelmeyer, DO      . levothyroxine (SYNTHROID, LEVOTHROID) tablet 25 mcg  25 mcg Oral QAC breakfast Alexis Hugelmeyer, DO      . magnesium citrate solution 1 Bottle  1 Bottle Oral Once PRN Alexis Hugelmeyer, DO      . memantine (NAMENDA XR) 24 hr capsule 28 mg  28 mg Oral Daily Alexis Hugelmeyer, DO      . ondansetron (ZOFRAN) tablet 4 mg  4 mg Oral Q6H PRN Alexis Hugelmeyer, DO       Or  . ondansetron (ZOFRAN) injection 4 mg  4 mg Intravenous Q6H PRN Alexis Hugelmeyer, DO      . senna-docusate (Senokot-S) tablet 1 tablet  1 tablet Oral QHS PRN Alexis Hugelmeyer, DO       . traMADol (ULTRAM) tablet 50 mg  50 mg Oral Q6H PRN Alexis Hugelmeyer, DO      . trifluridine (VIROPTIC) 1 % ophthalmic solution 1 drop  1 drop Right Eye Q2H while awake Alexis Hugelmeyer, DO   1 drop at 08/04/16 0531  . valACYclovir (VALTREX) tablet 1,000 mg  1,000 mg Oral TID Alexis Hugelmeyer, DO      . zolpidem (AMBIEN) tablet 5 mg  5 mg Oral QHS PRN Alexis Hugelmeyer, DO         Discharge Medications: Please see discharge summary for a list of discharge medications.  Relevant Imaging Results:  Relevant Lab Results:   Additional Information  (SSN: [id number]. Patient has shingles. )  Danie Chandler, Student-Social Work

## 2016-08-04 NOTE — H&P (Signed)
[facility] @ [facility] Admission History and Physical [employer], D.O.  ---------------------------------------------------------------------------------------------------------------------   PATIENT NAME: Joshua Zamora MR#: [id number] DATE OF BIRTH: [birth year] DATE OF ADMISSION: 08/03/2016 PRIMARY CARE PHYSICIAN: No PCP Per Patient  REQUESTING/REFERRING PHYSICIAN: ED Dr. Jimmye Norman  CHIEF COMPLAINT: Chief Complaint  Patient presents with  . Fatigue  . Anorexia    HISTORY OF PRESENT ILLNESS: Brex Hardister is a 82 y.o. male with a known history of Dementia, hypertension, hyperlipidemia, was seen here in this emergency department 2 days ago and diagnosed with shingles affecting his right eye. recent diagnosis of shingles he was discharged home with eyedrops, antivirals and steroids however patient's daughter returns with him tonight complaining that he has been increasingly lethargic, weak, decreased by mouth intake, decreased appetite and difficulty walking. With respect to his right eye family notes that symptoms are improving. Patient himself denies all complaints.  Otherwise there has been no change in status. Patient has been taking medication as prescribed and there has been no recent change in medication or diet.  There has been no recent, travel or sick contacts.    Patient denies fevers/chills, weakness, dizziness, chest pain, shortness of breath, N/V/C/D, abdominal pain, dysuria/frequency, changes in mental status.   PAST MEDICAL HISTORY: Past Medical History:  Diagnosis Date  . Dementia   . Hyperlipidemia   . Hypertension   CAD  PAST SURGICAL HISTORY: Past Surgical History:  Procedure Laterality Date  . CARDIAC SURGERY        SOCIAL HISTORY: Social History  Substance Use Topics  . Smoking status: Former Research scientist (life sciences)  . Smokeless tobacco: Never Used  . Alcohol use Not on file     FAMILY HISTORY: No family history on file.   MEDICATIONS AT  HOME: Prior to Admission medications   Medication Sig Start Date End Date Taking? Authorizing Provider  atenolol (TENORMIN) 25 MG tablet Take 25 mg by mouth daily.   Yes Historical Provider, MD  atorvastatin (LIPITOR) 40 MG tablet Take 40 mg by mouth every evening.   Yes Historical Provider, MD  cephALEXin (KEFLEX) 250 MG capsule Take 1 capsule (250 mg total) by mouth 4 (four) times daily. 07/31/16 08/10/16 Yes Earleen Newport, MD  donepezil (ARICEPT) 5 MG tablet Take 5 mg by mouth at bedtime.   Yes Historical Provider, MD  losartan-hydrochlorothiazide (HYZAAR) 50-12.5 MG tablet Take 1 tablet by mouth daily.   Yes Historical Provider, MD  NAMENDA XR 28 MG CP24 24 hr capsule Take 28 mg by mouth daily.   Yes Historical Provider, MD  predniSONE (STERAPRED UNI-PAK 21 TAB) 10 MG (21) TBPK tablet Dispense steroid taper pack as directed 07/31/16  Yes Earleen Newport, MD  SYNTHROID 25 MCG tablet Take 25 mcg by mouth daily.   Yes Historical Provider, MD  traMADol (ULTRAM) 50 MG tablet Take 1 tablet (50 mg total) by mouth every 6 (six) hours as needed. Patient taking differently: Take 50 mg by mouth every 6 (six) hours as needed for moderate pain.  07/31/16 07/31/17 Yes Earleen Newport, MD  trifluridine (VIROPTIC) 1 % ophthalmic solution Place 1 drop into the right eye every 2 (two) hours. 07/31/16  Yes Earleen Newport, MD  valACYclovir (VALTREX) 1000 MG tablet Take 1 tablet (1,000 mg total) by mouth 3 (three) times daily. 07/31/16  Yes Earleen Newport, MD      DRUG ALLERGIES: No Known Allergies   REVIEW OF SYSTEMS: CONSTITUTIONAL: No fever/chills, fatigue, weakness, weight gain/loss, headache EYES: No blurry  or double vision. ENT: No tinnitus, postnasal drip, redness or soreness of the oropharynx. RESPIRATORY: No cough, wheeze, hemoptysis, dyspnea. CARDIOVASCULAR: No chest pain, orthopnea, palpitations, syncope. GASTROINTESTINAL: No nausea, vomiting, constipation, diarrhea, abdominal  pain, hematemesis, melena or hematochezia. GENITOURINARY: No dysuria or hematuria. ENDOCRINE: No polyuria or nocturia. No heat or cold intolerance. HEMATOLOGY: No anemia, bruising, bleeding. INTEGUMENTARY: No rashes, ulcers, lesions. MUSCULOSKELETAL: No arthritis, swelling, gout. NEUROLOGIC: No numbness, tingling, weakness or ataxia. No seizure-type activity. PSYCHIATRIC: No anxiety, depression, insomnia.  PHYSICAL EXAMINATION: VITAL SIGNS: Blood pressure (!) 193/83, pulse 68, temperature 97.5 F (36.4 C), temperature source Oral, resp. rate 19, weight 62.7 kg (138 lb 4.8 oz), SpO2 97 %.  GENERAL: 82 y.o.-year-old white male patient, well-developed, well-nourished lying in the bed in no acute distress. Patient is slightly agitated, wanting to get out of bed. HEENT: Head atraumatic, normocephalic. Pupils equal, round, reactive to light and accommodation. No scleral icterus. Extraocular muscles intact. Nares are patent. Oropharynx is clear. Mucus membranes moist. NECK: Supple, full range of motion. No JVD, no bruit heard. No thyroid enlargement, no tenderness, no cervical lymphadenopathy. CHEST: Normal breath sounds bilaterally. No wheezing, rales, rhonchi or crackles. No use of accessory muscles of respiration.  No reproducible chest wall tenderness.  CARDIOVASCULAR: S1, S2 normal. No murmurs, rubs, or gallops. Cap refill <2 seconds. ABDOMEN: Soft, nontender, nondistended. No rebound, guarding, rigidity. Normoactive bowel sounds present in all four quadrants. No organomegaly or mass. EXTREMITIES: Full range of motion. No pedal edema, cyanosis, or clubbing. NEUROLOGIC: Cranial nerves II through XII are grossly intact with no focal sensorimotor deficit. Muscle strength 5/5 in all extremities. Sensation intact. Gait not checked. PSYCHIATRIC: The patient is alert and oriented x 3. Normal affect, mood, thought content. SKIN: Warm, dry, and intact without obvious rash, lesion, or ulcer with the  exception of right scalp and right eye erythema with discrete lesions are crusted over. There is some mild edema to the superior and inferior palpebra on the right side.  LABORATORY PANEL:  CBC  Recent Labs Lab 08/03/16 1657  WBC 11.4*  HGB 14.4  HCT 41.7  PLT 240   ----------------------------------------------------------------------------------------------------------------- Chemistries  Recent Labs Lab 08/03/16 1657  NA 129*  K 4.3  CL 96*  CO2 27  GLUCOSE 112*  BUN 22*  CREATININE 0.87  CALCIUM 9.1   ------------------------------------------------------------------------------------------------------------------ Cardiac Enzymes  Recent Labs Lab 08/03/16 1705  TROPONINI <0.03   ------------------------------------------------------------------------------------------------------------------  RADIOLOGY: Dg Chest 1 View  Result Date: 08/03/2016 CLINICAL DATA:  Weakness EXAM: CHEST 1 VIEW COMPARISON:  5/22/7 FINDINGS: Previous median sternotomy and CABG procedure. The heart size and mediastinal contours are within normal limits. Both lungs are clear. No pleural effusion or edema. The visualized skeletal structures are unremarkable. IMPRESSION: 1. No acute cardiopulmonary abnormalities Electronically Signed   By: Kerby Moors M.D.   On: 08/03/2016 19:22   Ct Head Wo Contrast  Result Date: 08/03/2016 CLINICAL DATA:  Per pt's family though pt has become decreasingly weak since he was last seen on Sunday for shingles dx. Pt appears sleepy at this time. Pt was a heavy assist from wheelchair to stretcher. Pt not able to support body weight. Per pt's family pt's shingles have improved. Pt had episodes of n/v on Monday and Tuesday and has had a decreased appetite since then. EXAM: CT HEAD WITHOUT CONTRAST TECHNIQUE: Contiguous axial images were obtained from the base of the skull through the vertex without intravenous contrast. COMPARISON:  None. FINDINGS: Brain: The  ventricles are normal in  size, for this patient's age, and normal in configuration. There are no parenchymal masses or mass effect. There is no evidence of a recent cortical infarct. Patchy areas of white matter hypoattenuation noted consistent with moderate chronic microvascular ischemic change. There are no extra-axial masses or abnormal fluid collections. There is no intracranial hemorrhage. Vascular: No hyperdense vessel or unexpected calcification. Skull: Normal. Negative for fracture or focal lesion. Sinuses/Orbits: No acute finding. Other: None IMPRESSION: 1. No acute intracranial abnormalities. 2. Age related volume loss. Moderate chronic microvascular ischemic change. Electronically Signed   By: Lajean Manes M.D.   On: 08/03/2016 18:50    EKG: Normal sinus rhythm at 70 bpm with a left axis, right bundle branch block and nonspecific ST and T wave changes  MPRESSION AND PLAN:  This is a 82 y.o. male with a history ofdementia, hypertension, hyperlipidemia and coronary artery disease now being admitted with: 1. Failure to thrive, weakness-admit for observation, IV fluid hydration and continuation of his regular home medications. Patient's family is currently in the process of moving the patient from an independent living situation to a dementia unit. They have plans in place for him upon discharge. We will in the meantime obtain a physical therapy evaluation 2.  Hyponatremia, mild-gentle IV fluid hydration. Repeat BMP in a.m. 3.  Recent diagnosis of shingles-continue Valtrex, steroids and eyedrops. 4. History of hypertension-continue atenolol, Hyzaar 5. History of hyperlipidemia-continue Lipitor 6. History of dementia-continue Aricept and Namenda 7. History of hypothyroidism-continue Synthroid 8. History of chronic pain-continue tramadol  Diet/Nutrition:  heart healthy Fluids:  IV normal saline DVT Px: Lovenox, SCDs and early ambulation Code Status: Full  All the records are reviewed and  case discussed with ED provider. Management plans discussed with the patient and/or family who express understanding and agree with plan of care.   TOTAL TIME TAKING CARE OF THIS PATIENT: 60 minutes.   Cherrelle Plante D.O. on 08/04/2016 at 1:26 AM Between 7am to 6pm - Pager - [phone] After 6pm go to [email or web address] - Marketing executive [facility] Office [phone] CC: Primary care physician; No PCP Per Patient     Note: This dictation was prepared with Dragon dictation along with smaller phrase technology. Any transcriptional errors that result from this process are unintentional.

## 2016-08-05 DIAGNOSIS — R531 Weakness: Secondary | ICD-10-CM | POA: Diagnosis not present

## 2016-08-05 DIAGNOSIS — G9340 Encephalopathy, unspecified: Secondary | ICD-10-CM | POA: Diagnosis not present

## 2016-08-05 DIAGNOSIS — E871 Hypo-osmolality and hyponatremia: Secondary | ICD-10-CM | POA: Diagnosis not present

## 2016-08-05 DIAGNOSIS — R627 Adult failure to thrive: Secondary | ICD-10-CM | POA: Diagnosis not present

## 2016-08-05 LAB — BASIC METABOLIC PANEL
Anion gap: 6 (ref 5–15)
BUN: 15 mg/dL (ref 6–20)
CALCIUM: 8.5 mg/dL — AB (ref 8.9–10.3)
CHLORIDE: 100 mmol/L — AB (ref 101–111)
CO2: 26 mmol/L (ref 22–32)
CREATININE: 0.75 mg/dL (ref 0.61–1.24)
Glucose, Bld: 92 mg/dL (ref 65–99)
Potassium: 3.5 mmol/L (ref 3.5–5.1)
SODIUM: 132 mmol/L — AB (ref 135–145)

## 2016-08-05 MED ORDER — ENSURE ENLIVE PO LIQD
237.0000 mL | Freq: Two times a day (BID) | ORAL | Status: DC
  Administered 2016-08-05 – 2016-08-07 (×5): 237 mL via ORAL

## 2016-08-05 NOTE — Progress Notes (Signed)
Clinical Education officer, museum (CSW) received a call from patient's daughter Gay Filler stating that they have chosen [facility] for rehab. [facility] liaison is aware of accepted bed offer. CSW will continue to follow and assist as needed.   [employer], LCSW [phone]

## 2016-08-05 NOTE — Progress Notes (Signed)
Spoke with Elta Guadeloupe, [facility] rep at [phone], to notify of non-emergent EMS transport.  Auth notification reference given as [id number].   Service date range good from 08/05/16 - 11/03/16.   Geographical gap exception requested to determine if services can be considered at an in-network level.

## 2016-08-05 NOTE — Evaluation (Signed)
Physical Therapy Evaluation Patient Details Name: Joshua RABBITT MRN: [id number] DOB: 1919-10-21 Today's Date: 08/05/2016   History of Present Illness  82 y/o male recently here with shingles and now returns with increased weakness. Pt in contact isolation room, apparently family is working on transition from independent living facility to memory care unit.  Clinical Impression  Pt with some confusion t/o the session but generally able to follow basic instructions and is willing to participate.  Pt shows age appropriate strength in U&LEs, but does poorly with standing balance/using walker and has multiple LOBs almost exclusively backward.  Pt overall showed good effort but was weak, confused and generally did now shows ability to return to independent living safely.      Follow Up Recommendations SNF    Equipment Recommendations  Rolling walker with 5" wheels (unsure if pt already has a walker)    Recommendations for Other Services       Precautions / Restrictions Precautions Precautions: Fall (air born isolation) Restrictions Weight Bearing Restrictions: No      Mobility  Bed Mobility Overal bed mobility: Needs Assistance Bed Mobility: Supine to Sit;Sit to Supine     Supine to sit: Min guard Sit to supine: Min assist;Mod assist   General bed mobility comments: Pt slow but able to get to EOB with bed rails, needs considerable assist getting LEs back into bed  Transfers Overall transfer level: Needs assistance Equipment used: Rolling walker (2 wheeled) Transfers: Sit to/from Stand Sit to Stand: Min assist         General transfer comment: Pt shows very good effort getting to standing but is not able to shift weight forward and does need light assist.  Ambulation/Gait Ambulation/Gait assistance: Mod assist Ambulation Distance (Feet): 4 Feet Assistive device: Rolling walker (2 wheeled)       General Gait Details: Pt is able to take a few steps but is unsteady,  somewhat impulsive and looses his balance backwards multiple times t/o the session  Stairs            Wheelchair Mobility    Modified Rankin (Stroke Patients Only)       Balance Overall balance assessment: Needs assistance   Sitting balance-Leahy Scale: Good     Standing balance support: Bilateral upper extremity supported Standing balance-Leahy Scale: Poor Standing balance comment: Pt shows poor balance and leaning/loosing balance backward despite much cuing and assist with walker                             Pertinent Vitals/Pain Pain Assessment: No/denies pain (but reports he has chronic pain most of the time)    Home Living Family/patient expects to be discharged to:: Skilled nursing facility                      Prior Function Level of Independence: Independent with assistive device(s)         Comments: Pt unable to consistently report, appears he was able to get around with [facility]     Hand Dominance        Extremity/Trunk Assessment   Upper Extremity Assessment: Overall WFL for tasks assessed;Generalized weakness (age appropriate deficits)           Lower Extremity Assessment: Overall WFL for tasks assessed;Generalized weakness (age appropriate deficits)         Communication   Communication: No difficulties  Cognition Arousal/Alertness: Lethargic Behavior During Therapy: Impulsive Overall  Cognitive Status: History of cognitive impairments - at baseline                      General Comments      Exercises     Assessment/Plan    PT Assessment Patient needs continued PT services  PT Problem List Decreased strength;Decreased activity tolerance;Decreased balance;Decreased knowledge of use of DME;Decreased safety awareness          PT Treatment Interventions Gait training;DME instruction;Functional mobility training;Therapeutic activities;Therapeutic exercise;Balance training;Neuromuscular  re-education;Cognitive remediation;Patient/family education    PT Goals (Current goals can be found in the Care Plan section)  Acute Rehab PT Goals Patient Stated Goal: pt unable to state PT Goal Formulation: Patient unable to participate in goal setting Time For Goal Achievement: 08/19/16 Potential to Achieve Goals: Fair    Frequency Min 2X/week   Barriers to discharge        Co-evaluation               End of Session Equipment Utilized During Treatment: Gait belt;Oxygen Activity Tolerance: Patient limited by fatigue Patient left: with bed alarm set;with call bell/phone within reach;with nursing/sitter in room Nurse Communication: Mobility status    Functional Assessment Tool Used: clinical judgement Functional Limitation: Mobility: Walking and moving around Mobility: Walking and Moving Around Current Status [id number]): At least 40 percent but less than 60 percent impaired, limited or restricted Mobility: Walking and Moving Around Goal Status [phone]): At least 1 percent but less than 20 percent impaired, limited or restricted    Time: [id number] PT Time Calculation (min) (ACUTE ONLY): 24 min   Charges:   PT Evaluation $PT Eval Low Complexity: 1 Procedure     PT G Codes:   PT G-Codes **NOT FOR INPATIENT CLASS** Functional Assessment Tool Used: clinical judgement Functional Limitation: Mobility: Walking and moving around Mobility: Walking and Moving Around Current Status [id number]): At least 40 percent but less than 60 percent impaired, limited or restricted Mobility: Walking and Moving Around Goal Status [phone]): At least 1 percent but less than 20 percent impaired, limited or restricted    Kreg Shropshire, DPT 08/05/2016, 5:21 PM

## 2016-08-05 NOTE — Progress Notes (Signed)
[place] [facility] [place] NAME: Joshua Zamora    MR#:  [id number]  DATE OF BIRTH:  01/07/1919  SUBJECTIVE:   Pt. Here due to weakness and poor by mouth intake and difficulty walking. Remains encephalopathic.    REVIEW OF SYSTEMS:    Review of Systems  Unable to perform ROS: Dementia    Nutrition: Soft diet Tolerating Diet: Very little Tolerating PT: Await Eval.    DRUG ALLERGIES:  No Known Allergies  VITALS:  Blood pressure (!) 158/78, pulse 69, temperature (!) 94.7 F (34.8 C), temperature source Axillary, resp. rate 16, height 5\' 10"  (1.778 m), weight 62.7 kg (138 lb 4.8 oz), SpO2 97 %.  PHYSICAL EXAMINATION:   Physical Exam  GENERAL:  82 y.o.-year-old patient lying in the bed lethargic/encephalopathic EYES: Pupils equal, round, reactive to light. No scleral icterus. Extraocular muscles intact.  HEENT: Head atraumatic, normocephalic. Oropharynx and nasopharynx clear. Dry Oral mucosa NECK:  Supple, no jugular venous distention. No thyroid enlargement, no tenderness.  LUNGS: Normal breath sounds bilaterally, no wheezing, rales, rhonchi. No use of accessory muscles of respiration.  CARDIOVASCULAR: S1, S2 normal. No murmurs, rubs, or gallops.  ABDOMEN: Soft, nontender, nondistended. Bowel sounds present. No organomegaly or mass.  EXTREMITIES: No cyanosis, clubbing or edema b/l.    NEUROLOGIC: Difficult to do a full neurological exam is patient is encephalopathic. Moves all extremities spontaneously.  PSYCHIATRIC: The patient is alert and oriented x 1.  SKIN: No obvious rash, lesion, or ulcer.    LABORATORY PANEL:   CBC  Recent Labs Lab 08/04/16 0502  WBC 12.0*  HGB 14.4  HCT 42.2  PLT 230   ------------------------------------------------------------------------------------------------------------------  Chemistries   Recent Labs Lab 08/04/16 0502 08/05/16 0548  NA 133* 132*  K 3.9 3.5  CL 100* 100*  CO2 27 26   GLUCOSE 118* 92  BUN 20 15  CREATININE 0.66 0.75  CALCIUM 8.8* 8.5*  MG 2.1  --    ------------------------------------------------------------------------------------------------------------------  Cardiac Enzymes  Recent Labs Lab 08/03/16 1705  TROPONINI <0.03   ------------------------------------------------------------------------------------------------------------------  RADIOLOGY:  Dg Chest 1 View  Result Date: 08/03/2016 CLINICAL DATA:  Weakness EXAM: CHEST 1 VIEW COMPARISON:  5/22/7 FINDINGS: Previous median sternotomy and CABG procedure. The heart size and mediastinal contours are within normal limits. Both lungs are clear. No pleural effusion or edema. The visualized skeletal structures are unremarkable. IMPRESSION: 1. No acute cardiopulmonary abnormalities Electronically Signed   By: Kerby Moors M.D.   On: 08/03/2016 19:22   Ct Head Wo Contrast  Result Date: 08/03/2016 CLINICAL DATA:  Per pt's family though pt has become decreasingly weak since he was last seen on Sunday for shingles dx. Pt appears sleepy at this time. Pt was a heavy assist from wheelchair to stretcher. Pt not able to support body weight. Per pt's family pt's shingles have improved. Pt had episodes of n/v on Monday and Tuesday and has had a decreased appetite since then. EXAM: CT HEAD WITHOUT CONTRAST TECHNIQUE: Contiguous axial images were obtained from the base of the skull through the vertex without intravenous contrast. COMPARISON:  None. FINDINGS: Brain: The ventricles are normal in size, for this patient's age, and normal in configuration. There are no parenchymal masses or mass effect. There is no evidence of a recent cortical infarct. Patchy areas of white matter hypoattenuation noted consistent with moderate chronic microvascular ischemic change. There are no extra-axial masses or abnormal fluid collections. There is no intracranial hemorrhage. Vascular: No hyperdense  vessel or unexpected  calcification. Skull: Normal. Negative for fracture or focal lesion. Sinuses/Orbits: No acute finding. Other: None IMPRESSION: 1. No acute intracranial abnormalities. 2. Age related volume loss. Moderate chronic microvascular ischemic change. Electronically Signed   By: Lajean Manes M.D.   On: 08/03/2016 18:50     ASSESSMENT AND PLAN:   82 year old male with past medical history of dementia, HTN, hyperlipidemia recently diagnosed shingles who presented to the hospital due to weakness and poor by mouth intake.  1. weakness/altered mental status-secondary to dementia combined with underlying encephalopathy secondary to benzodiazepines. - not much improved today and cont. Supportive care for now.  Avoid Benzo's.  - PRN haldol for agitation.  Follow mental status.   2. Failure to thrive/weakness - cont. IV fluids.  - PT eval pending.   3. Shingles - cont. Valtrex.   - cont. Airborne, contact precautions.   4. HTN - cannot take PO meds now.  - PRN IV hydralazine.   5. Dementia - cont. Aricept.  - PRN haldol for agitation.  Hold Benzo's.   6. Hypothyroidism - cont. Synthroid.    All the records are reviewed and case discussed with Care Management/Social Worker. Management plans discussed with the patient, family and they are in agreement.  CODE STATUS: Full Code  DVT Prophylaxis: Lovenox  TOTAL TIME TAKING CARE OF THIS PATIENT: 30 minutes.   POSSIBLE D/C IN 1-2 DAYS, DEPENDING ON CLINICAL CONDITION.   Henreitta Leber M.D on 08/05/2016 at 2:32 PM  Between 7am to 6pm - Pager - [phone]  After 6pm go to [email or web address] - [employer] brewer [facility]  Office  [phone]  CC: Primary care physician; No PCP Per Patient

## 2016-08-05 NOTE — Progress Notes (Signed)
PT Cancellation Note  Patient Details Name: Joshua Zamora MRN: [id number] DOB: Mar 01, 1919   Cancelled Treatment:    Reason Eval/Treat Not Completed: Patient's level of consciousness Attempted X 2 this AM, pt sleeping, will try back later.  Kreg Shropshire 08/05/2016, 1:36 PM

## 2016-08-05 NOTE — Progress Notes (Signed)
Initial Nutrition Assessment  DOCUMENTATION CODES:   Severe malnutrition in context of chronic illness  INTERVENTION:  -Ensure Enlive po BID, each supplement provides 350 kcal and 20 grams of protein  NUTRITION DIAGNOSIS:   Malnutrition related to chronic illness as evidenced by severe depletion of muscle mass, severe depletion of body fat.  GOAL:   Patient will meet greater than or equal to 90% of their needs  MONITOR:   PO intake, I & O's, Labs, Weight trends, Supplement acceptance  REASON FOR ASSESSMENT:   Malnutrition Screening Tool    ASSESSMENT:   Joshua Zamora is a 82 y.o. male with a known history of Dementia, hypertension, hyperlipidemia, was seen here in this emergency department 2 days ago and diagnosed with shingles affecting his right eye. recent diagnosis of shingles he was discharged home with eyedrops, antivirals and steroids however patient's daughter returns with him tonight complaining that he has been increasingly lethargic, weak, decreased by mouth intake, decreased appetite and difficulty walking.  Was unable to wake Mr. Estella at bedside. Performed NFPE, found severe fat depletions, moderate-severe muscle wasting, and no edema. Spoke with Nurse tech at bedside who states pt only had a few sips of juice this morning, but did have multiple BMs today, and multiple yesterday. No wt hx available. Apparently he was eating well PTA. Pt is still full-code, and thus we cannot rule out nutrition support at this time - if pt's PO does not improve. Labs and medications reviewed: Na 132 NS @ 22mL/hr  Diet Order:  DIET SOFT Room service appropriate? Yes; Fluid consistency: Thin  Skin:  Reviewed, no issues  Last BM:  9/29  Height:   Ht Readings from Last 1 Encounters:  08/03/16 5\' 10"  (1.778 m)    Weight:   Wt Readings from Last 1 Encounters:  08/03/16 138 lb 4.8 oz (62.7 kg)    Ideal Body Weight:  75.45 kg  BMI:  Body mass index is 19.84  kg/m.  Estimated Nutritional Needs:   Kcal:  1300-1600 calories  Protein:  62-75 gm  Fluid:  >/= 1.3L  EDUCATION NEEDS:   No education needs identified at this time  Satira Anis. Gean Larose, MS, RD LDN Inpatient Clinical Dietitian Pager [phone]

## 2016-08-06 DIAGNOSIS — G9340 Encephalopathy, unspecified: Secondary | ICD-10-CM | POA: Diagnosis not present

## 2016-08-06 DIAGNOSIS — R627 Adult failure to thrive: Secondary | ICD-10-CM | POA: Diagnosis not present

## 2016-08-06 DIAGNOSIS — R531 Weakness: Secondary | ICD-10-CM | POA: Diagnosis not present

## 2016-08-06 DIAGNOSIS — B029 Zoster without complications: Secondary | ICD-10-CM | POA: Diagnosis not present

## 2016-08-06 LAB — BASIC METABOLIC PANEL
Anion gap: 5 (ref 5–15)
BUN: 14 mg/dL (ref 6–20)
CHLORIDE: 103 mmol/L (ref 101–111)
CO2: 25 mmol/L (ref 22–32)
CREATININE: 0.8 mg/dL (ref 0.61–1.24)
Calcium: 8.2 mg/dL — ABNORMAL LOW (ref 8.9–10.3)
GFR calc Af Amer: >60 mL/min (ref >60)
GFR calc non Af Amer: >60 mL/min (ref >60)
GLUCOSE: 101 mg/dL — AB (ref 65–99)
POTASSIUM: 3.2 mmol/L — AB (ref 3.5–5.1)
SODIUM: 133 mmol/L — AB (ref 135–145)

## 2016-08-06 MED ORDER — VALACYCLOVIR HCL 500 MG PO TABS
1000.0000 mg | ORAL_TABLET | Freq: Two times a day (BID) | ORAL | Status: DC
  Administered 2016-08-06 – 2016-08-08 (×4): 1000 mg via ORAL
  Filled 2016-08-06 (×4): qty 2

## 2016-08-06 MED ORDER — POTASSIUM CHLORIDE CRYS ER 20 MEQ PO TBCR
40.0000 meq | EXTENDED_RELEASE_TABLET | Freq: Once | ORAL | Status: AC
  Administered 2016-08-06: 40 meq via ORAL
  Filled 2016-08-06: qty 2

## 2016-08-06 MED ORDER — QUETIAPINE FUMARATE 25 MG PO TABS
12.5000 mg | ORAL_TABLET | Freq: Every evening | ORAL | Status: DC | PRN
Start: 2016-08-06 — End: 2016-08-08

## 2016-08-06 NOTE — Progress Notes (Signed)
Patient ID: Joshua Zamora, male   DOB: May 01, 1919, 82 y.o.   MRN: [id number]  [employer] PROGRESS NOTE  HENNY FINO [id number] DOB: June 02, 1919 DOA: 08/03/2016 PCP: No PCP Per Patient  HPI/Subjective: Patient answers some yes or no questions. Did not offer any complaints.  Objective: Vitals:   08/06/16 0720 08/06/16 1457  BP: 140/78 124/68  Pulse: 68 77  Resp: 18 18  Temp: 97.6 F (36.4 C) 98.6 F (37 C)    Filed Weights   08/03/16 2350  Weight: 62.7 kg (138 lb 4.8 oz)    ROS: Review of Systems  Constitutional: Negative for chills and fever.  Eyes: Negative for blurred vision.  Respiratory: Negative for cough and shortness of breath.   Cardiovascular: Negative for chest pain.  Gastrointestinal: Negative for abdominal pain, constipation, diarrhea, nausea and vomiting.  Genitourinary: Negative for dysuria.  Musculoskeletal: Negative for joint pain.  Neurological: Negative for dizziness and headaches.   Exam: Physical Exam  HENT:  Nose: No mucosal edema.  Mouth/Throat: No oropharyngeal exudate or posterior oropharyngeal edema.  Eyes: Conjunctivae, EOM and lids are normal. Pupils are equal, round, and reactive to light.  Neck: No JVD present. Carotid bruit is not present. No edema present. No thyroid mass and no thyromegaly present.  Cardiovascular: S1 normal and S2 normal.  Exam reveals no gallop.   No murmur heard. Pulses:      Dorsalis pedis pulses are 2+ on the right side, and 2+ on the left side.  Respiratory: No respiratory distress. He has no wheezes. He has no rhonchi. He has no rales.  GI: Soft. Bowel sounds are normal. There is no tenderness.  Musculoskeletal:       Right ankle: He exhibits no swelling.       Left ankle: He exhibits no swelling.  Lymphadenopathy:    He has no cervical adenopathy.  Neurological: He is alert. No cranial nerve deficit.  Skin: Skin is warm. Nails show no clubbing.  Some healing pustules over her right occipital  area  Psychiatric: He has a normal mood and affect.      Data Reviewed: Basic Metabolic Panel:  Recent Labs Lab 07/31/16 2153 08/03/16 1657 08/04/16 0502 08/05/16 0548 08/06/16 0320  NA 134* 129* 133* 132* 133*  K 3.6 4.3 3.9 3.5 3.2*  CL 101 96* 100* 100* 103  CO2 26 27 27 26 25   GLUCOSE 189* 112* 118* 92 101*  BUN 20 22* 20 15 14   CREATININE 0.97 0.87 0.66 0.75 0.80  CALCIUM 9.3 9.1 8.8* 8.5* 8.2*  MG  --   --  2.1  --   --   PHOS  --   --  2.5  --   --    CBC:  Recent Labs Lab 07/31/16 2153 08/03/16 1657 08/04/16 0502  WBC 8.9 11.4* 12.0*  NEUTROABS 7.5*  --   --   HGB 14.0 14.4 14.4  HCT 40.4 41.7 42.2  MCV 94.0 93.2 94.6  PLT 216 240 230   Cardiac Enzymes:  Recent Labs Lab 08/03/16 1705  TROPONINI <0.03    Scheduled Meds: . atenolol  25 mg Oral Daily  . atorvastatin  40 mg Oral QPM  . donepezil  5 mg Oral QHS  . enoxaparin (LOVENOX) injection  40 mg Subcutaneous QHS  . feeding supplement (ENSURE ENLIVE)  237 mL Oral BID BM  . losartan  50 mg Oral Daily   And  . hydrochlorothiazide  12.5 mg Oral Daily  . levothyroxine  25 mcg Oral QAC breakfast  . memantine  28 mg Oral Daily  . trifluridine  1 drop Right Eye Q2H while awake  . valACYclovir  1,000 mg Oral Q12H    Assessment/Plan:  1. Acute encephalopathy. Likely underlying dementia and worsened with Ativan. When necessary Haldol. When necessary Seroquel at night. 2. Failure to thrive and weakness. Physical therapy recommended rehabilitation. 3. Recent diagnosis of shingles on Valtrex 4. Essential hypertension. On atenolol and hydrochlorothiazide and losartan. 5. Hypothyroidism unspecified on levothyroxine 6. Dementia with behavioral disturbance on Namenda and Aricept. When necessary Haldol  Code Status:     Code Status Orders        Start     Ordered   08/03/16 2341  Full code  Continuous     08/03/16 2340    Code Status History    Date Active Date Inactive Code Status Order ID  Comments User Context   This patient has a current code status but no historical code status.    Advance Directive Documentation   Flowsheet Row Most Recent Value  Type of Advance Directive  Healthcare Power of Attorney  Pre-existing out of facility DNR order (yellow form or pink MOST form)  No data  "MOST" Form in Place?  No data     Family Communication: Spoke with daughter Disposition Plan: Potentially out to rehabilitation tomorrow  Time spent: 11 minutes  [place], [place]

## 2016-08-06 NOTE — Progress Notes (Signed)
Patient is alert to self only. Airborne/contact precautions. Family at bedside. Takes meds crushed in applesauce. Potassium 3.2, gave oral supplement. Fair/good diet. like chocolate ensures. SL IV. On 2L, O2, Sats 97%. Incont of urine. LBM 9/29.

## 2016-08-07 DIAGNOSIS — R627 Adult failure to thrive: Secondary | ICD-10-CM | POA: Diagnosis not present

## 2016-08-07 DIAGNOSIS — B029 Zoster without complications: Secondary | ICD-10-CM | POA: Diagnosis not present

## 2016-08-07 DIAGNOSIS — G9340 Encephalopathy, unspecified: Secondary | ICD-10-CM | POA: Diagnosis not present

## 2016-08-07 DIAGNOSIS — R531 Weakness: Secondary | ICD-10-CM | POA: Diagnosis not present

## 2016-08-07 MED ORDER — TRAMADOL HCL 50 MG PO TABS: 50.0000 mg | ORAL_TABLET | Freq: Four times a day (QID) | ORAL | 0 refills | Status: AC | PRN

## 2016-08-07 MED ORDER — SENNOSIDES-DOCUSATE SODIUM 8.6-50 MG PO TABS: 1.0000 | ORAL_TABLET | Freq: Two times a day (BID) | ORAL | 0 refills | Status: AC

## 2016-08-07 MED ORDER — VALACYCLOVIR HCL 1 G PO TABS: 1000.0000 mg | ORAL_TABLET | Freq: Two times a day (BID) | ORAL | 0 refills | Status: AC

## 2016-08-07 MED ORDER — ENSURE ENLIVE PO LIQD: 237.0000 mL | Freq: Two times a day (BID) | ORAL | 0 refills | Status: AC

## 2016-08-07 MED ORDER — BISACODYL 10 MG RE SUPP
10.0000 mg | Freq: Once | RECTAL | Status: AC
  Administered 2016-08-07: 10 mg via RECTAL
  Filled 2016-08-07: qty 1

## 2016-08-07 MED ORDER — LACTULOSE 10 GM/15ML PO SOLN
20.0000 g | Freq: Once | ORAL | Status: AC
Start: 2016-08-07 — End: 2016-08-07
  Administered 2016-08-07: 20 g via ORAL
  Filled 2016-08-07: qty 30

## 2016-08-07 NOTE — Clinical Social Work Note (Signed)
Patient not able to dc to [facility] due to wing closure at [facility]. According to [facility] at [facility], the patient can transfer tomorrow, 08/07/2016. CSW contacted other facilities who had accepted the patient, and none can accept him today. The client's family is aware and would still prefer [facility] for dc on 10/2. CSW will con't to follow.  Santiago Bumpers, MSW, LCSW-A [phone]

## 2016-08-07 NOTE — Progress Notes (Signed)
Patient ID: Joshua Zamora, male   DOB: 10-05-1919, 82 y.o.   MRN: [id number]  [employer] PROGRESS NOTE  Joshua Zamora [id number] DOB: Jul 12, 1919 DOA: 08/03/2016 PCP: No PCP Per Patient  HPI/Subjective: Patient answers some yes or no questions. Did not offer any complaints.  Objective: Vitals:   08/07/16 0445 08/07/16 0800  BP: (!) 156/62 (!) 152/60  Pulse: 69 65  Resp: 19 18  Temp: 99 F (37.2 C) 98 F (36.7 C)    Filed Weights   08/03/16 2350  Weight: 62.7 kg (138 lb 4.8 oz)    ROS: Review of Systems  Constitutional: Negative for chills and fever.  Eyes: Negative for blurred vision.  Respiratory: Negative for cough and shortness of breath.   Cardiovascular: Negative for chest pain.  Gastrointestinal: Positive for abdominal pain and constipation. Negative for diarrhea, nausea and vomiting.  Genitourinary: Negative for dysuria.  Musculoskeletal: Negative for joint pain.  Neurological: Negative for dizziness and headaches.   Exam: Physical Exam  HENT:  Nose: No mucosal edema.  Mouth/Throat: No oropharyngeal exudate or posterior oropharyngeal edema.  Eyes: Conjunctivae, EOM and lids are normal. Pupils are equal, round, and reactive to light.  Neck: No JVD present. Carotid bruit is not present. No edema present. No thyroid mass and no thyromegaly present.  Cardiovascular: S1 normal and S2 normal.  Exam reveals no gallop.   No murmur heard. Pulses:      Dorsalis pedis pulses are 2+ on the right side, and 2+ on the left side.  Respiratory: No respiratory distress. He has no wheezes. He has no rhonchi. He has no rales.  GI: Soft. Bowel sounds are normal. There is tenderness in the left lower quadrant.  Musculoskeletal:       Right ankle: He exhibits no swelling.       Left ankle: He exhibits no swelling.  Lymphadenopathy:    He has no cervical adenopathy.  Neurological: He is alert. No cranial nerve deficit.  Skin: Skin is warm. Nails show no clubbing.   Some healing pustules over her right occipital area  Psychiatric: He has a normal mood and affect.      Data Reviewed: Basic Metabolic Panel:  Recent Labs Lab 07/31/16 2153 08/03/16 1657 08/04/16 0502 08/05/16 0548 08/06/16 0320  NA 134* 129* 133* 132* 133*  K 3.6 4.3 3.9 3.5 3.2*  CL 101 96* 100* 100* 103  CO2 26 27 27 26 25   GLUCOSE 189* 112* 118* 92 101*  BUN 20 22* 20 15 14   CREATININE 0.97 0.87 0.66 0.75 0.80  CALCIUM 9.3 9.1 8.8* 8.5* 8.2*  MG  --   --  2.1  --   --   PHOS  --   --  2.5  --   --    CBC:  Recent Labs Lab 07/31/16 2153 08/03/16 1657 08/04/16 0502  WBC 8.9 11.4* 12.0*  NEUTROABS 7.5*  --   --   HGB 14.0 14.4 14.4  HCT 40.4 41.7 42.2  MCV 94.0 93.2 94.6  PLT 216 240 230   Cardiac Enzymes:  Recent Labs Lab 08/03/16 1705  TROPONINI <0.03    Scheduled Meds: . atenolol  25 mg Oral Daily  . atorvastatin  40 mg Oral QPM  . donepezil  5 mg Oral QHS  . enoxaparin (LOVENOX) injection  40 mg Subcutaneous QHS  . feeding supplement (ENSURE ENLIVE)  237 mL Oral BID BM  . losartan  50 mg Oral Daily   And  .  hydrochlorothiazide  12.5 mg Oral Daily  . levothyroxine  25 mcg Oral QAC breakfast  . memantine  28 mg Oral Daily  . trifluridine  1 drop Right Eye Q2H while awake  . valACYclovir  1,000 mg Oral Q12H    Assessment/Plan:  1. Acute encephalopathy. Patient back to baseline as per daughter. Likely underlying dementia and worsened with Ativan. When necessary Haldol. When necessary Seroquel at night. 2. Failure to thrive and weakness. Physical therapy recommended rehabilitation. 3. Recent diagnosis of shingles on Valtrex 4. Essential hypertension. On atenolol and hydrochlorothiazide and losartan. 5. Hypothyroidism unspecified on levothyroxine 6. Dementia with behavioral disturbance on Namenda and Aricept. When necessary Haldol 7. Patient was set up for discharge today but unfortunately the facility was unable to accommodate this  today.  Code Status:     Code Status Orders        Start     Ordered   08/03/16 2341  Full code  Continuous     08/03/16 2340    Code Status History    Date Active Date Inactive Code Status Order ID Comments User Context   This patient has a current code status but no historical code status.    Advance Directive Documentation   Flowsheet Row Most Recent Value  Type of Advance Directive  Healthcare Power of Attorney  Pre-existing out of facility DNR order (yellow form or pink MOST form)  No data  "MOST" Form in Place?  No data     Family Communication: Spoke with daughter Disposition Plan: Unfortunately Facility was unable to take the patient today and discharge need to be canceled.  Time spent: 40 minutes  Loletha Grayer  [employer]

## 2016-08-07 NOTE — Progress Notes (Signed)
Patient is alert to self only.On Airborne/ contact isolation. Scabbed over areas on head, right side of head, and chest area. Incont of urine. LBM this shift. Family at bedside. Increased weakness. C/O pain x1 this am, gave Ultram, with good relief. On RA.

## 2016-08-07 NOTE — Care Management Note (Signed)
Case Management Note  Patient Details  Name: Joshua Zamora MRN: [id number] Date of Birth: 19-Mar-1919  Subjective/Objective:         No bed available at [facility] today per CSW. Current discharge plan is to discharge to [facility] on Monday 08/08/16.          Action/Plan:   Expected Discharge Date:                  Expected Discharge Plan:     In-House Referral:     Discharge planning Services     Post Acute Care Choice:    Choice offered to:     DME Arranged:    DME Agency:     HH Arranged:    HH Agency:     Status of Service:     If discussed at [employer] of Stay Meetings, dates discussed:    Additional Comments:  Duong Haydel A, RN 08/07/2016, 10:27 AM

## 2016-08-07 NOTE — Discharge Summary (Signed)
[place] at [place] NAME: Joshua Zamora    MR#:  [id number]  DATE OF BIRTH:  [birth year]  DATE OF ADMISSION:  08/03/2016 ADMITTING PHYSICIAN: Harvie Bridge, DO  DATE OF DISCHARGE: 08/07/2016  PRIMARY CARE PHYSICIAN: No PCP Per Patient    ADMISSION DIAGNOSIS:  Shingles [B02.9] Hyponatremia [E87.1] Weakness [R53.1]  DISCHARGE DIAGNOSIS:  Active Problems:   Weakness   SECONDARY DIAGNOSIS:   Past Medical History:  Diagnosis Date  . Dementia   . Hyperlipidemia   . Hypertension     HOSPITAL COURSE:   1. Acute encephalopathy. Patient was given Ativan which made his mental status worse. With his underlying dementia Ativan is not a good medication for him. Avoid benzodiazepines. In the hospital we did give as necessary Haldol. His mental status has improved and I would try to hold off on antipsychotic medications at this time. 2. Failure to thrive and weakness. Physical therapy recommended rehabilitation. Prescribe and sure. 3. Possible shingles. Continue Valtrex for a few more days. 4. Essential hypertension. Continue atenolol, hydrochlorothiazide and losartan 5. Hypothyroidism unspecified on levothyroxine 6. Dementia with behavioral disturbance on Namenda and Aricept. Try to hold off on antipsychotic medications. We used Haldol as needed in the hospital but mental status back to baseline as per the daughter.  7. Some lower abdominal pain and constipation. Standing dose senna Colace. We'll give Dulcolax suppository and lactulose today. 8. Relative hyponatremia. Likely from not eating very well. If this continues can consider stopping hydrochlorothiazide. Recommend checking a BMP in 1 week. 9. Hypokalemia this was replaced during the hospital course. Also again likely related to not eating very well. Supplement should help. Again if this persists can consider stopping hydrochlorothiazide. Recommend checking a BMP in 1 week  DISCHARGE  CONDITIONS:   Satisfactory  CONSULTS OBTAINED:   none  DRUG ALLERGIES:  No Known Allergies  DISCHARGE MEDICATIONS:   Current Discharge Medication List    START taking these medications   Details  feeding supplement, ENSURE ENLIVE, (ENSURE ENLIVE) LIQD Take 237 mLs by mouth 2 (two) times daily between meals. Qty: 60 Bottle, Refills: 0    senna-docusate (SENOKOT-S) 8.6-50 MG tablet Take 1 tablet by mouth 2 (two) times daily. Qty: 60 tablet, Refills: 0      CONTINUE these medications which have CHANGED   Details  traMADol (ULTRAM) 50 MG tablet Take 1 tablet (50 mg total) by mouth every 6 (six) hours as needed for moderate pain. Qty: 20 tablet, Refills: 0    valACYclovir (VALTREX) 1000 MG tablet Take 1 tablet (1,000 mg total) by mouth 2 (two) times daily. Qty: 10 tablet, Refills: 0      CONTINUE these medications which have NOT CHANGED   Details  atenolol (TENORMIN) 25 MG tablet Take 25 mg by mouth daily.    atorvastatin (LIPITOR) 40 MG tablet Take 40 mg by mouth every evening.    donepezil (ARICEPT) 5 MG tablet Take 5 mg by mouth at bedtime.    losartan-hydrochlorothiazide (HYZAAR) 50-12.5 MG tablet Take 1 tablet by mouth daily.    NAMENDA XR 28 MG CP24 24 hr capsule Take 28 mg by mouth daily.    SYNTHROID 25 MCG tablet Take 25 mcg by mouth daily.    trifluridine (VIROPTIC) 1 % ophthalmic solution Place 1 drop into the right eye every 2 (two) hours. Qty: 7.5 mL, Refills: 0      STOP taking these medications     cephALEXin (KEFLEX) 250 MG capsule  predniSONE (STERAPRED UNI-PAK 21 TAB) 10 MG (21) TBPK tablet          DISCHARGE INSTRUCTIONS:   Follow-up doctor at rehabilitation 1-2 days.  If you experience worsening of your admission symptoms, develop shortness of breath, life threatening emergency, suicidal or homicidal thoughts you must seek medical attention immediately by calling 911 or calling your MD immediately  if symptoms less severe.  You Must  read complete instructions/literature along with all the possible adverse reactions/side effects for all the Medicines you take and that have been prescribed to you. Take any new Medicines after you have completely understood and accept all the possible adverse reactions/side effects.   Please note  You were cared for by a hospitalist during your hospital stay. If you have any questions about your discharge medications or the care you received while you were in the hospital after you are discharged, you can call the unit and asked to speak with the hospitalist on call if the hospitalist that took care of you is not available. Once you are discharged, your primary care physician will handle any further medical issues. Please note that NO REFILLS for any discharge medications will be authorized once you are discharged, as it is imperative that you return to your primary care physician (or establish a relationship with a primary care physician if you do not have one) for your aftercare needs so that they can reassess your need for medications and monitor your lab values.    Today   CHIEF COMPLAINT:   Chief Complaint  Patient presents with  . Fatigue  . Anorexia    HISTORY OF PRESENT ILLNESS:  Joshua Zamora  is a 82 y.o. male brought in with fatigue and not eating well.   VITAL SIGNS:  Blood pressure (!) 152/60, pulse 65, temperature 98 F (36.7 C), temperature source Oral, resp. rate 18, height 5\' 10"  (1.778 m), weight 62.7 kg (138 lb 4.8 oz), SpO2 97 %.    PHYSICAL EXAMINATION:  GENERAL:  82 y.o.-year-old patient lying in the bed with no acute distress.  EYES: Pupils equal, round, reactive to light and accommodation. No scleral icterus. Extraocular muscles intact.  HEENT: Head atraumatic, normocephalic. Oropharynx and nasopharynx clear.  NECK:  Supple, no jugular venous distention. No thyroid enlargement, no tenderness.  LUNGS: Normal breath sounds bilaterally, no wheezing,  rales,rhonchi or crepitation. No use of accessory muscles of respiration.  CARDIOVASCULAR: S1, S2 normal. No murmurs, rubs, or gallops.  ABDOMEN: Soft, slight left lower quadrant tenderness, non-distended. Bowel sounds present. No organomegaly or mass.  EXTREMITIES: No pedal edema, cyanosis, or clubbing.  NEUROLOGIC: Cranial nerves II through XII are intact. Muscle strength 5/5 in all extremities. Sensation intact. Gait not checked.  PSYCHIATRIC: The patient is alert and answers some yes or no questions.  SKIN: Scabs over right occipital region. Patient actually itching them and blood coming from one of them.  DATA REVIEW:   CBC  Recent Labs Lab 08/04/16 0502  WBC 12.0*  HGB 14.4  HCT 42.2  PLT 230    Chemistries   Recent Labs Lab 08/04/16 0502  08/06/16 0320  NA 133*  < > 133*  K 3.9  < > 3.2*  CL 100*  < > 103  CO2 27  < > 25  GLUCOSE 118*  < > 101*  BUN 20  < > 14  CREATININE 0.66  < > 0.80  CALCIUM 8.8*  < > 8.2*  MG 2.1  --   --   < > =  values in this interval not displayed.  Cardiac Enzymes  Recent Labs Lab 08/03/16 1705  TROPONINI <0.03     Management plans discussed with the patient, family and they are in agreement.  CODE STATUS:     Code Status Orders        Start     Ordered   08/03/16 2341  Full code  Continuous     08/03/16 2340    Code Status History    Date Active Date Inactive Code Status Order ID Comments User Context   This patient has a current code status but no historical code status.    Advance Directive Documentation   Flowsheet Row Most Recent Value  Type of Advance Directive  Healthcare Power of Attorney  Pre-existing out of facility DNR order (yellow form or pink MOST form)  No data  "MOST" Form in Place?  No data      TOTAL TIME TAKING CARE OF THIS PATIENT: 35 minutes.    Loletha Grayer M.D on 08/07/2016 at 8:47 AM  Between 7am to 6pm - Pager - [phone]  After 6pm go to [email or web address] - password [email or web address]  [facility] Office  [phone]  CC: Primary care physician; No PCP Per Patient

## 2016-08-08 DIAGNOSIS — Z87891 Personal history of nicotine dependence: Secondary | ICD-10-CM | POA: Diagnosis not present

## 2016-08-08 DIAGNOSIS — G8911 Acute pain due to trauma: Secondary | ICD-10-CM | POA: Diagnosis not present

## 2016-08-08 DIAGNOSIS — I10 Essential (primary) hypertension: Secondary | ICD-10-CM | POA: Diagnosis not present

## 2016-08-08 DIAGNOSIS — M6281 Muscle weakness (generalized): Secondary | ICD-10-CM | POA: Diagnosis not present

## 2016-08-08 DIAGNOSIS — G92 Toxic encephalopathy: Secondary | ICD-10-CM | POA: Diagnosis not present

## 2016-08-08 DIAGNOSIS — Z79899 Other long term (current) drug therapy: Secondary | ICD-10-CM | POA: Diagnosis not present

## 2016-08-08 DIAGNOSIS — E038 Other specified hypothyroidism: Secondary | ICD-10-CM | POA: Diagnosis not present

## 2016-08-08 DIAGNOSIS — R6889 Other general symptoms and signs: Secondary | ICD-10-CM | POA: Diagnosis not present

## 2016-08-08 DIAGNOSIS — R627 Adult failure to thrive: Secondary | ICD-10-CM | POA: Diagnosis not present

## 2016-08-08 DIAGNOSIS — D649 Anemia, unspecified: Secondary | ICD-10-CM | POA: Diagnosis not present

## 2016-08-08 DIAGNOSIS — E876 Hypokalemia: Secondary | ICD-10-CM | POA: Diagnosis not present

## 2016-08-08 DIAGNOSIS — R262 Difficulty in walking, not elsewhere classified: Secondary | ICD-10-CM | POA: Diagnosis not present

## 2016-08-08 DIAGNOSIS — R1312 Dysphagia, oropharyngeal phase: Secondary | ICD-10-CM | POA: Diagnosis not present

## 2016-08-08 DIAGNOSIS — Z7401 Bed confinement status: Secondary | ICD-10-CM | POA: Diagnosis not present

## 2016-08-08 DIAGNOSIS — B029 Zoster without complications: Secondary | ICD-10-CM | POA: Diagnosis not present

## 2016-08-08 DIAGNOSIS — R300 Dysuria: Secondary | ICD-10-CM | POA: Diagnosis not present

## 2016-08-08 DIAGNOSIS — E785 Hyperlipidemia, unspecified: Secondary | ICD-10-CM | POA: Diagnosis not present

## 2016-08-08 DIAGNOSIS — E871 Hypo-osmolality and hyponatremia: Secondary | ICD-10-CM | POA: Diagnosis not present

## 2016-08-08 DIAGNOSIS — K5900 Constipation, unspecified: Secondary | ICD-10-CM | POA: Diagnosis not present

## 2016-08-08 DIAGNOSIS — K5909 Other constipation: Secondary | ICD-10-CM | POA: Diagnosis not present

## 2016-08-08 DIAGNOSIS — G9340 Encephalopathy, unspecified: Secondary | ICD-10-CM | POA: Diagnosis not present

## 2016-08-08 DIAGNOSIS — R531 Weakness: Secondary | ICD-10-CM | POA: Diagnosis not present

## 2016-08-08 DIAGNOSIS — I2510 Atherosclerotic heart disease of native coronary artery without angina pectoris: Secondary | ICD-10-CM | POA: Diagnosis not present

## 2016-08-08 DIAGNOSIS — B0239 Other herpes zoster eye disease: Secondary | ICD-10-CM | POA: Diagnosis not present

## 2016-08-08 DIAGNOSIS — E039 Hypothyroidism, unspecified: Secondary | ICD-10-CM | POA: Diagnosis not present

## 2016-08-08 DIAGNOSIS — T424X5A Adverse effect of benzodiazepines, initial encounter: Secondary | ICD-10-CM | POA: Diagnosis not present

## 2016-08-08 NOTE — Discharge Summary (Signed)
[place] at [place] NAME: Joshua Zamora    MR#:  [id number]  DATE OF BIRTH:  [birth year]  DATE OF ADMISSION:  08/03/2016 ADMITTING PHYSICIAN: Harvie Bridge, DO  DATE OF DISCHARGE: 08/08/2016  PRIMARY CARE PHYSICIAN: No PCP Per Patient    ADMISSION DIAGNOSIS:  Shingles [B02.9] Hyponatremia [E87.1] Weakness [R53.1]  DISCHARGE DIAGNOSIS:  Active Problems:   Weakness Acute encephalopathy Failure to thrive and weakness SECONDARY DIAGNOSIS:   Past Medical History:  Diagnosis Date  . Dementia   . Hyperlipidemia   . Hypertension     HOSPITAL COURSE:   1. Acute encephalopathy. Patient was given Ativan which made his mental status worse. With his underlying dementia Ativan is not a good medication for him. Avoid benzodiazepines. In the hospital we did give as necessary Haldol. His mental status has improved and I would try to hold off on antipsychotic medications at this time. 2. Failure to thrive and weakness. Physical therapy recommended rehabilitation. Prescribe and sure. 3. Possible shingles. Continue Valtrex for a few more days. 4. Essential hypertension. Continue atenolol, hydrochlorothiazide and losartan 5. Hypothyroidism unspecified on levothyroxine 6. Dementia with behavioral disturbance on Namenda and Aricept. Try to hold off on antipsychotic medications. We used Haldol as needed in the hospital but mental status back to baseline as per the daughter.  7. Some lower abdominal pain and constipation. Standing dose senna Colace. We'll give Dulcolax suppository and lactulose today. 8. Relative hyponatremia. Likely from not eating very well. If this continues can consider stopping hydrochlorothiazide. Recommend checking a BMP in 1 week. 9. Hypokalemia this was replaced during the hospital course. Also again likely related to not eating very well. Supplement should help. Again if this persists can consider stopping hydrochlorothiazide.  Recommend checking a BMP in 1 week  DISCHARGE CONDITIONS:   Stable, discharge to  SNF today.  CONSULTS OBTAINED:   none  DRUG ALLERGIES:  No Known Allergies  DISCHARGE MEDICATIONS:   Current Discharge Medication List    START taking these medications   Details  feeding supplement, ENSURE ENLIVE, (ENSURE ENLIVE) LIQD Take 237 mLs by mouth 2 (two) times daily between meals. Qty: 60 Bottle, Refills: 0    senna-docusate (SENOKOT-S) 8.6-50 MG tablet Take 1 tablet by mouth 2 (two) times daily. Qty: 60 tablet, Refills: 0      CONTINUE these medications which have CHANGED   Details  traMADol (ULTRAM) 50 MG tablet Take 1 tablet (50 mg total) by mouth every 6 (six) hours as needed for moderate pain. Qty: 20 tablet, Refills: 0    valACYclovir (VALTREX) 1000 MG tablet Take 1 tablet (1,000 mg total) by mouth 2 (two) times daily. Qty: 10 tablet, Refills: 0      CONTINUE these medications which have NOT CHANGED   Details  atenolol (TENORMIN) 25 MG tablet Take 25 mg by mouth daily.    atorvastatin (LIPITOR) 40 MG tablet Take 40 mg by mouth every evening.    donepezil (ARICEPT) 5 MG tablet Take 5 mg by mouth at bedtime.    losartan-hydrochlorothiazide (HYZAAR) 50-12.5 MG tablet Take 1 tablet by mouth daily.    NAMENDA XR 28 MG CP24 24 hr capsule Take 28 mg by mouth daily.    SYNTHROID 25 MCG tablet Take 25 mcg by mouth daily.    trifluridine (VIROPTIC) 1 % ophthalmic solution Place 1 drop into the right eye every 2 (two) hours. Qty: 7.5 mL, Refills: 0      STOP taking these  medications     cephALEXin (KEFLEX) 250 MG capsule      predniSONE (STERAPRED UNI-PAK 21 TAB) 10 MG (21) TBPK tablet          DISCHARGE INSTRUCTIONS:   Follow-up doctor at rehabilitation 1-2 days.  If you experience worsening of your admission symptoms, develop shortness of breath, life threatening emergency, suicidal or homicidal thoughts you must seek medical attention immediately by calling 911  or calling your MD immediately  if symptoms less severe.  You Must read complete instructions/literature along with all the possible adverse reactions/side effects for all the Medicines you take and that have been prescribed to you. Take any new Medicines after you have completely understood and accept all the possible adverse reactions/side effects.   Please note  You were cared for by a hospitalist during your hospital stay. If you have any questions about your discharge medications or the care you received while you were in the hospital after you are discharged, you can call the unit and asked to speak with the hospitalist on call if the hospitalist that took care of you is not available. Once you are discharged, your primary care physician will handle any further medical issues. Please note that NO REFILLS for any discharge medications will be authorized once you are discharged, as it is imperative that you return to your primary care physician (or establish a relationship with a primary care physician if you do not have one) for your aftercare needs so that they can reassess your need for medications and monitor your lab values.    Today   CHIEF COMPLAINT:   Chief Complaint  Patient presents with  . Fatigue  . Anorexia    HISTORY OF PRESENT ILLNESS:  Alvar Huscher  is a 82 y.o. male brought in with fatigue and not eating well.   VITAL SIGNS:  Blood pressure (!) 134/55, pulse 70, temperature 98.7 F (37.1 C), temperature source Oral, resp. rate 16, height 5\' 10"  (1.778 m), weight 138 lb 4.8 oz (62.7 kg), SpO2 95 %.    PHYSICAL EXAMINATION:  GENERAL:  82 y.o.-year-old patient lying in the bed with no acute distress.  EYES: Pupils equal, round, reactive to light and accommodation. No scleral icterus. Extraocular muscles intact.  HEENT: Head atraumatic, normocephalic. Oropharynx and nasopharynx clear.  NECK:  Supple, no jugular venous distention. No thyroid enlargement, no  tenderness.  LUNGS: Normal breath sounds bilaterally, no wheezing, rales,rhonchi or crepitation. No use of accessory muscles of respiration.  CARDIOVASCULAR: S1, S2 normal. No murmurs, rubs, or gallops.  ABDOMEN: Soft, slight left lower quadrant tenderness, non-distended. Bowel sounds present. No organomegaly or mass.  EXTREMITIES: No pedal edema, cyanosis, or clubbing.  NEUROLOGIC: Cranial nerves II through XII are intact. Muscle strength 5/5 in all extremities. Sensation intact. Gait not checked.  PSYCHIATRIC: The patient is alert and answers some yes or no questions.  SKIN: Scabs over right occipital region. Patient actually itching them and blood coming from one of them.  DATA REVIEW:   CBC  Recent Labs Lab 08/04/16 0502  WBC 12.0*  HGB 14.4  HCT 42.2  PLT 230    Chemistries   Recent Labs Lab 08/04/16 0502  08/06/16 0320  NA 133*  < > 133*  K 3.9  < > 3.2*  CL 100*  < > 103  CO2 27  < > 25  GLUCOSE 118*  < > 101*  BUN 20  < > 14  CREATININE 0.66  < > 0.80  CALCIUM 8.8*  < > 8.2*  MG 2.1  --   --   < > = values in this interval not displayed.  Cardiac Enzymes  Recent Labs Lab 08/03/16 1705  TROPONINI <0.03     Management plans discussed with the patient, family and they are in agreement.  CODE STATUS:     Code Status Orders        Start     Ordered   08/03/16 2341  Full code  Continuous     08/03/16 2340    Code Status History    Date Active Date Inactive Code Status Order ID Comments User Context   This patient has a current code status but no historical code status.    Advance Directive Documentation   Flowsheet Row Most Recent Value  Type of Advance Directive  Healthcare Power of Attorney  Pre-existing out of facility DNR order (yellow form or pink MOST form)  No data  "MOST" Form in Place?  No data      TOTAL TIME TAKING CARE OF THIS PATIENT: 33 minutes.    Demetrios Loll M.D on 08/08/2016 at 2:09 PM  Between 7am to 6pm - Pager -  [phone]  After 6pm go to [email or web address] - password [email or web address]  [facility] Office  [phone]  CC: Primary care physician; No PCP Per Patient

## 2016-08-08 NOTE — Discharge Instructions (Signed)
Aspiration and fall precaution. Soft diet.

## 2016-08-08 NOTE — Clinical Social Work Placement (Signed)
   CLINICAL SOCIAL WORK PLACEMENT  NOTE  Date:  08/08/2016  Patient Details  Name: Joshua Zamora MRN: [id number] Date of Birth: July 08, 1919  Clinical Social Work is seeking post-discharge placement for this patient at the [place] level of care (*CSW will initial, date and re-position this form in  chart as items are completed):  Yes   Patient/family provided with [place] Work Department's list of facilities offering this level of care within the geographic area requested by the patient (or if unable, by the patient's family).  Yes   Patient/family informed of their freedom to choose among providers that offer the needed level of care, that participate in Medicare, Medicaid or managed care program needed by the patient, have an available bed and are willing to accept the patient.  Yes   Patient/family informed of [facility]'s ownership interest in [facility] and [facility], as well as of the fact that they are under no obligation to receive care at these facilities.  PASRR submitted to EDS on 08/04/16     PASRR number received on 08/04/16     Existing PASRR number confirmed on       FL2 transmitted to all facilities in geographic area requested by pt/family on 08/04/16     FL2 transmitted to all facilities within larger geographic area on       Patient informed that his/her managed care company has contracts with or will negotiate with certain facilities, including the following:        Yes   Patient/family informed of bed offers received.  Patient chooses bed at  ([facility] )     Physician recommends and patient chooses bed at      Patient to be transferred to  ([facility] ) on 08/08/16.  Patient to be transferred to facility by  [facility] )     Patient family notified on 08/08/16 of transfer.  Name of family member notified:   (Patient's daughter Joshua Zamora is aware of D/C today. )     PHYSICIAN       Additional Comment:     _______________________________________________ Sharayah Renfrow, Veronia Beets, LCSW 08/08/2016, 10:36 AM

## 2016-08-08 NOTE — Progress Notes (Signed)
Patient is medically stable for D/C to [facility] today. Per [facility] liaison patient will go to room 604. RN will call report to Yaakov Guthrie at [phone] and arrange EMS for transport. Clinical Education officer, museum (CSW) sent D/C orders to [facility] via [employer]. CSW contacted patient's daughter Gay Filler and made her aware of above. Please reconsult if future social work needs arise. CSW signing off.   [employer], LCSW [phone]

## 2016-08-08 NOTE — Progress Notes (Addendum)
Called report to Kim at [employer]. Answered all questions. Patient going to rm 604. Called EMS and daughter to notify transfer. VSS at this time.

## 2016-09-07 DEATH — deceased

## 2016-11-24 IMAGING — CT CT HEAD W/O CM
3 series · 14 of 47 positions shown, 16 images · non-contrast
Comparison: None.

CLINICAL DATA: Per pt's family though pt has become decreasingly
weak since he was last seen on [REDACTED] for shingles dx. Pt appears
sleepy at this time. Pt was a heavy assist from wheelchair to
stretcher. Pt not able to support body weight. Per pt's family pt's
shingles have improved. Pt had episodes of n/v on [REDACTED] and [REDACTED]
and has had a decreased appetite since then.

EXAM:
CT HEAD WITHOUT CONTRAST
TECHNIQUE: Contiguous axial images were obtained from the base of the skull
through the vertex without intravenous contrast.

[Series 2: head wo · axial · 0.39mm/px · z∈[-65,+60]mm · 8 of 31 slices shown, 10 images]
[im 3/31  brain]
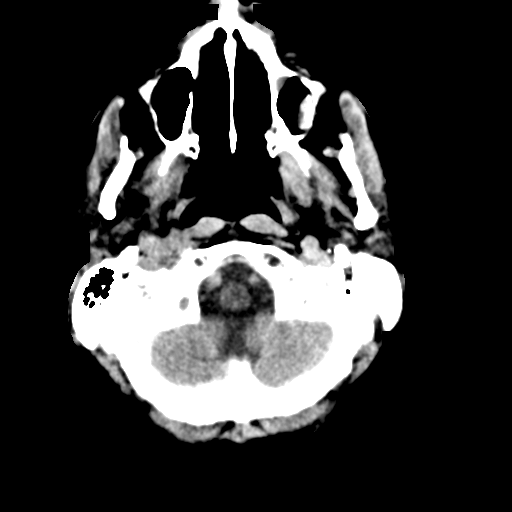
[im 3/31  bone]
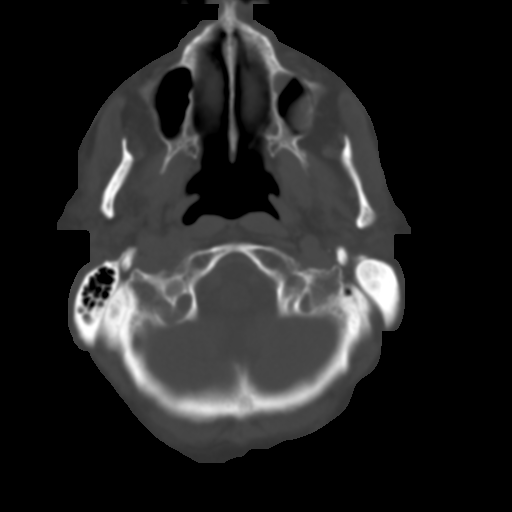
[im 7/31  brain]
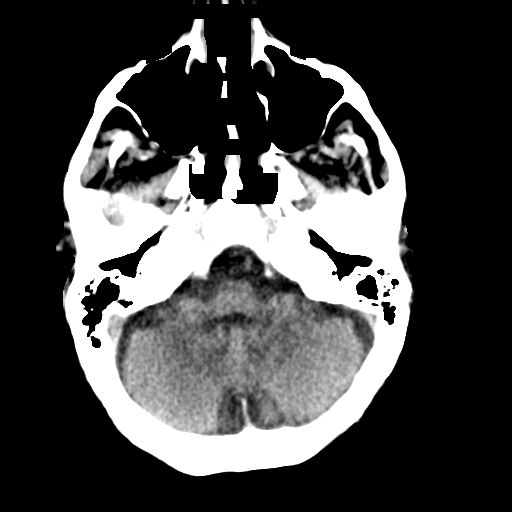
[im 10/31  brain]
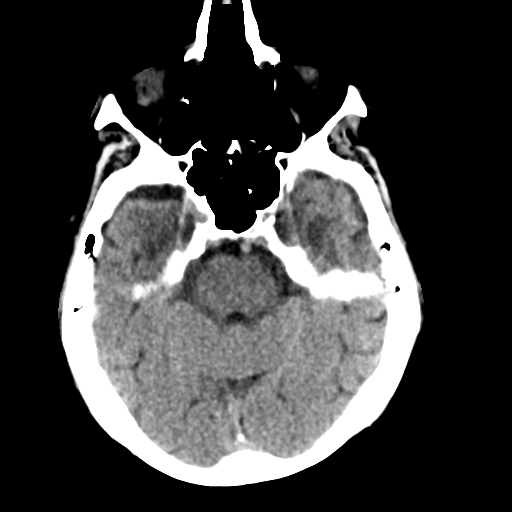
[im 14/31  brain]
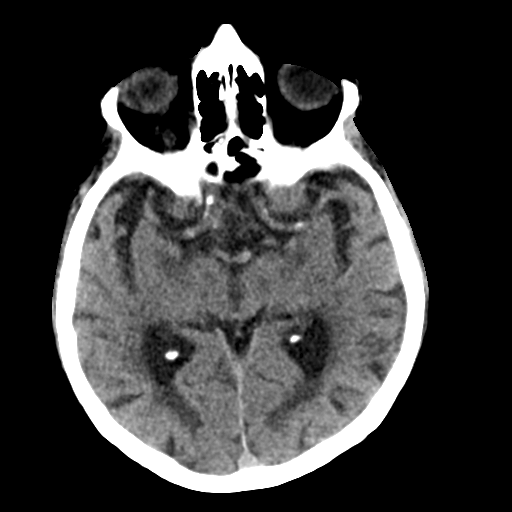
[im 17/31  brain]
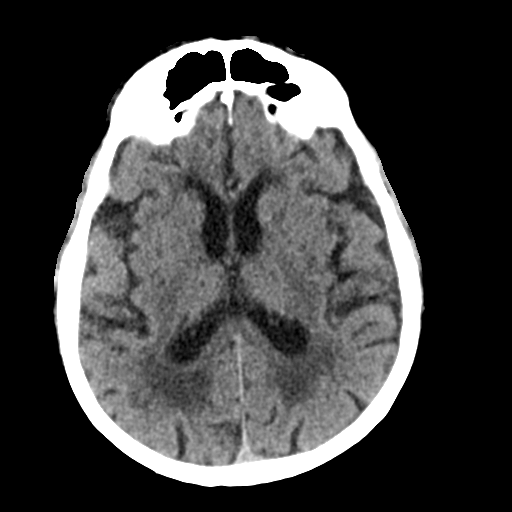
[im 17/31  bone]
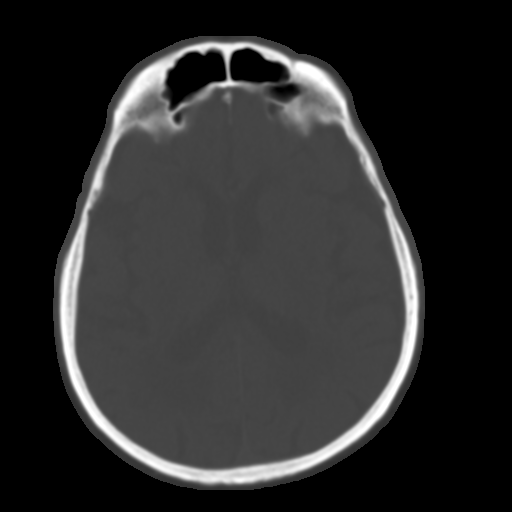
[im 21/31  brain]
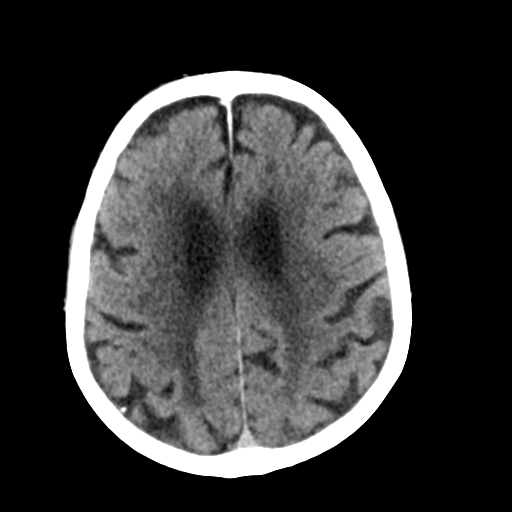
[im 24/31  brain]
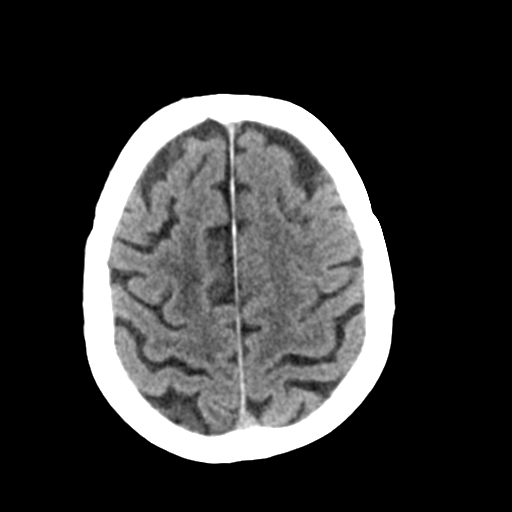
[im 28/31  brain]
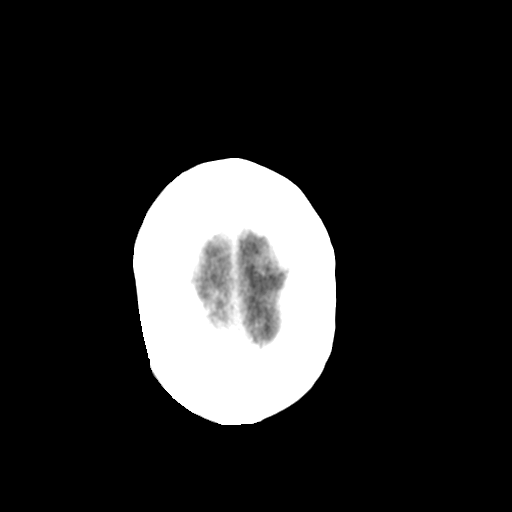

[Series 4: coronal soft tissue · coronal · 0.28mm/px · 3 of 61 slices shown]
[im 21/61  brain]
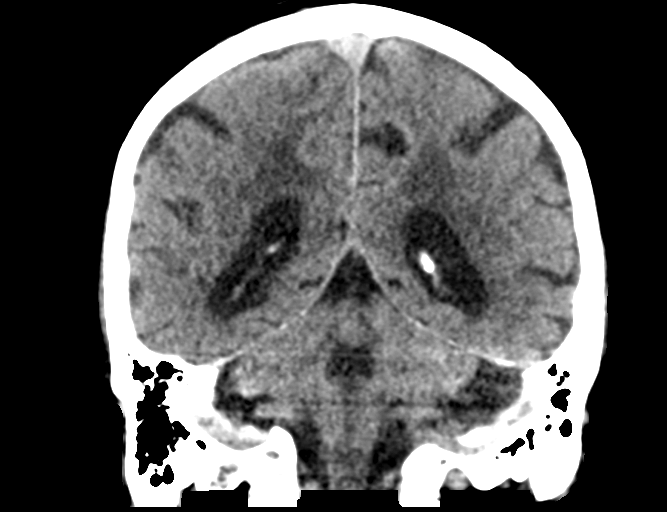
[im 27/61  brain]
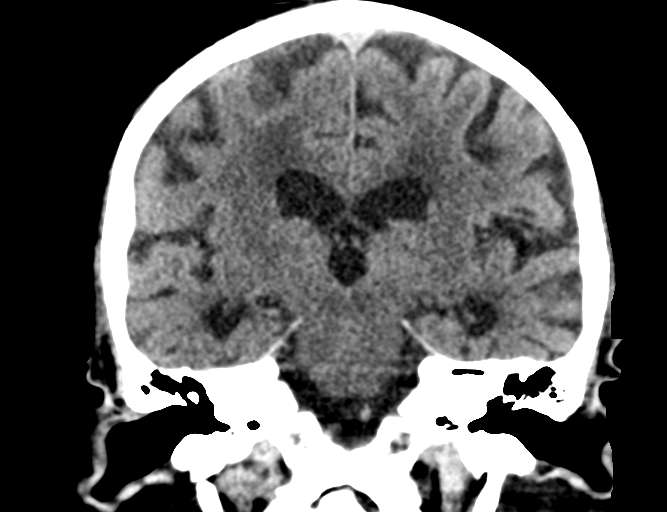
[im 34/61  brain]
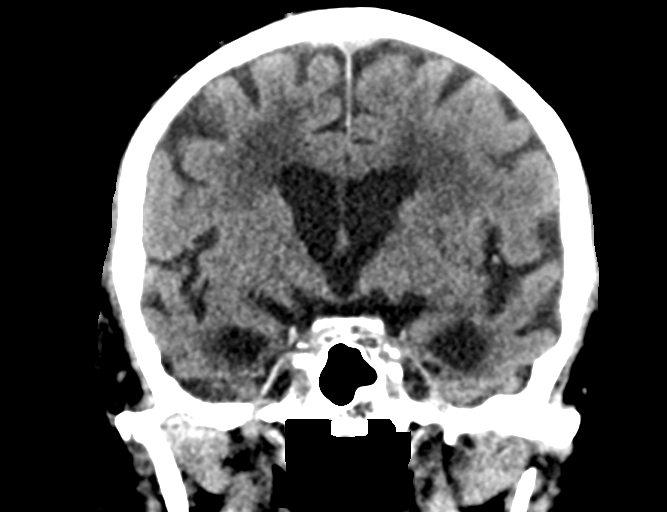

[Series 5: sagittal soft tissue · sagittal · 0.29mm/px · 3 of 49 slices shown]
[im 17/49  brain]
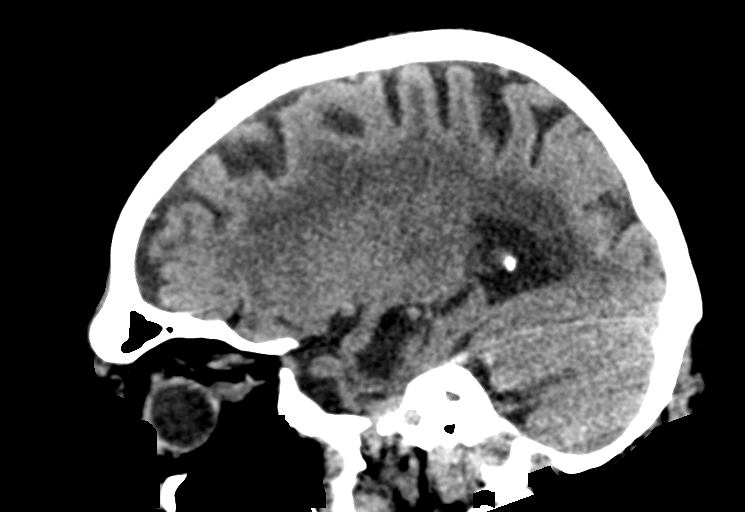
[im 25/49  brain]
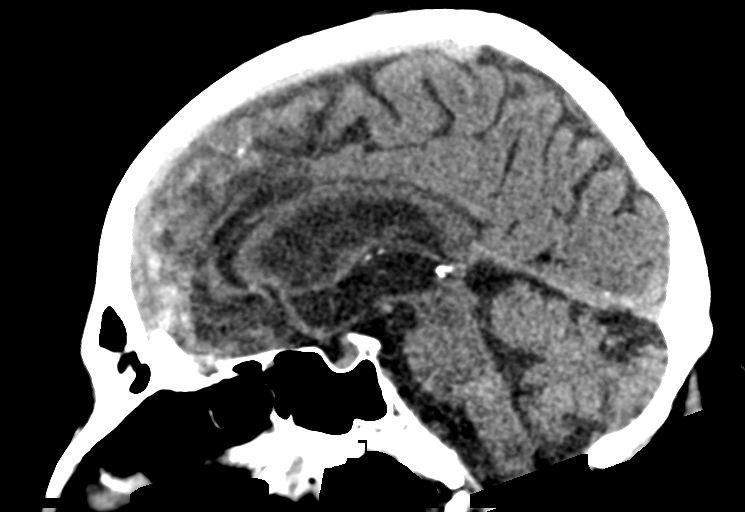
[im 33/49  brain]
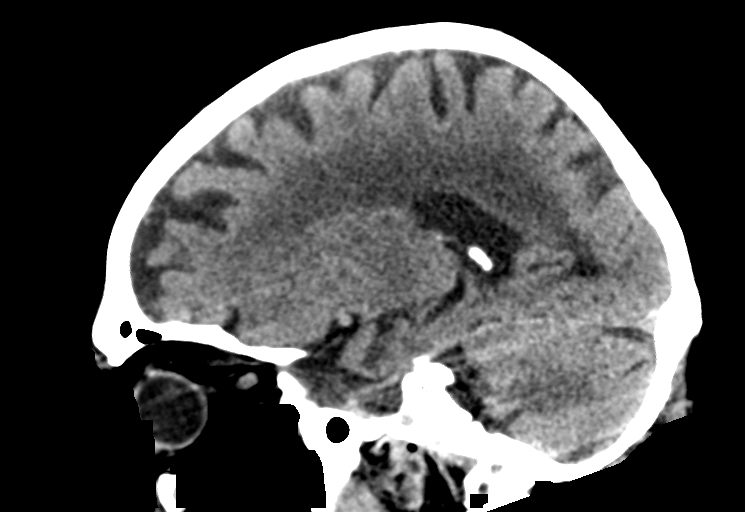

[14 of 47 positions shown; findings below may reference images not displayed]

FINDINGS: Brain: The ventricles are normal in size, for this patient's age,
and normal in configuration.

There are no parenchymal masses or mass effect. There is no evidence
of a recent cortical infarct. Patchy areas of white matter
hypoattenuation noted consistent with moderate chronic microvascular
ischemic change.

There are no extra-axial masses or abnormal fluid collections.

There is no intracranial hemorrhage.

Vascular: No hyperdense vessel or unexpected calcification.

Skull: Normal. Negative for fracture or focal lesion.

Sinuses/Orbits: No acute finding.

Other: None
IMPRESSION: 1. No acute intracranial abnormalities.
2. Age related volume loss. Moderate chronic microvascular ischemic
change.

## 2016-11-24 IMAGING — CR DG CHEST 1V
1 series · 1 of 1 positions shown · non-contrast
Comparison: [DATE]

CLINICAL DATA: Weakness

EXAM:
CHEST 1 VIEW

[chest ap]
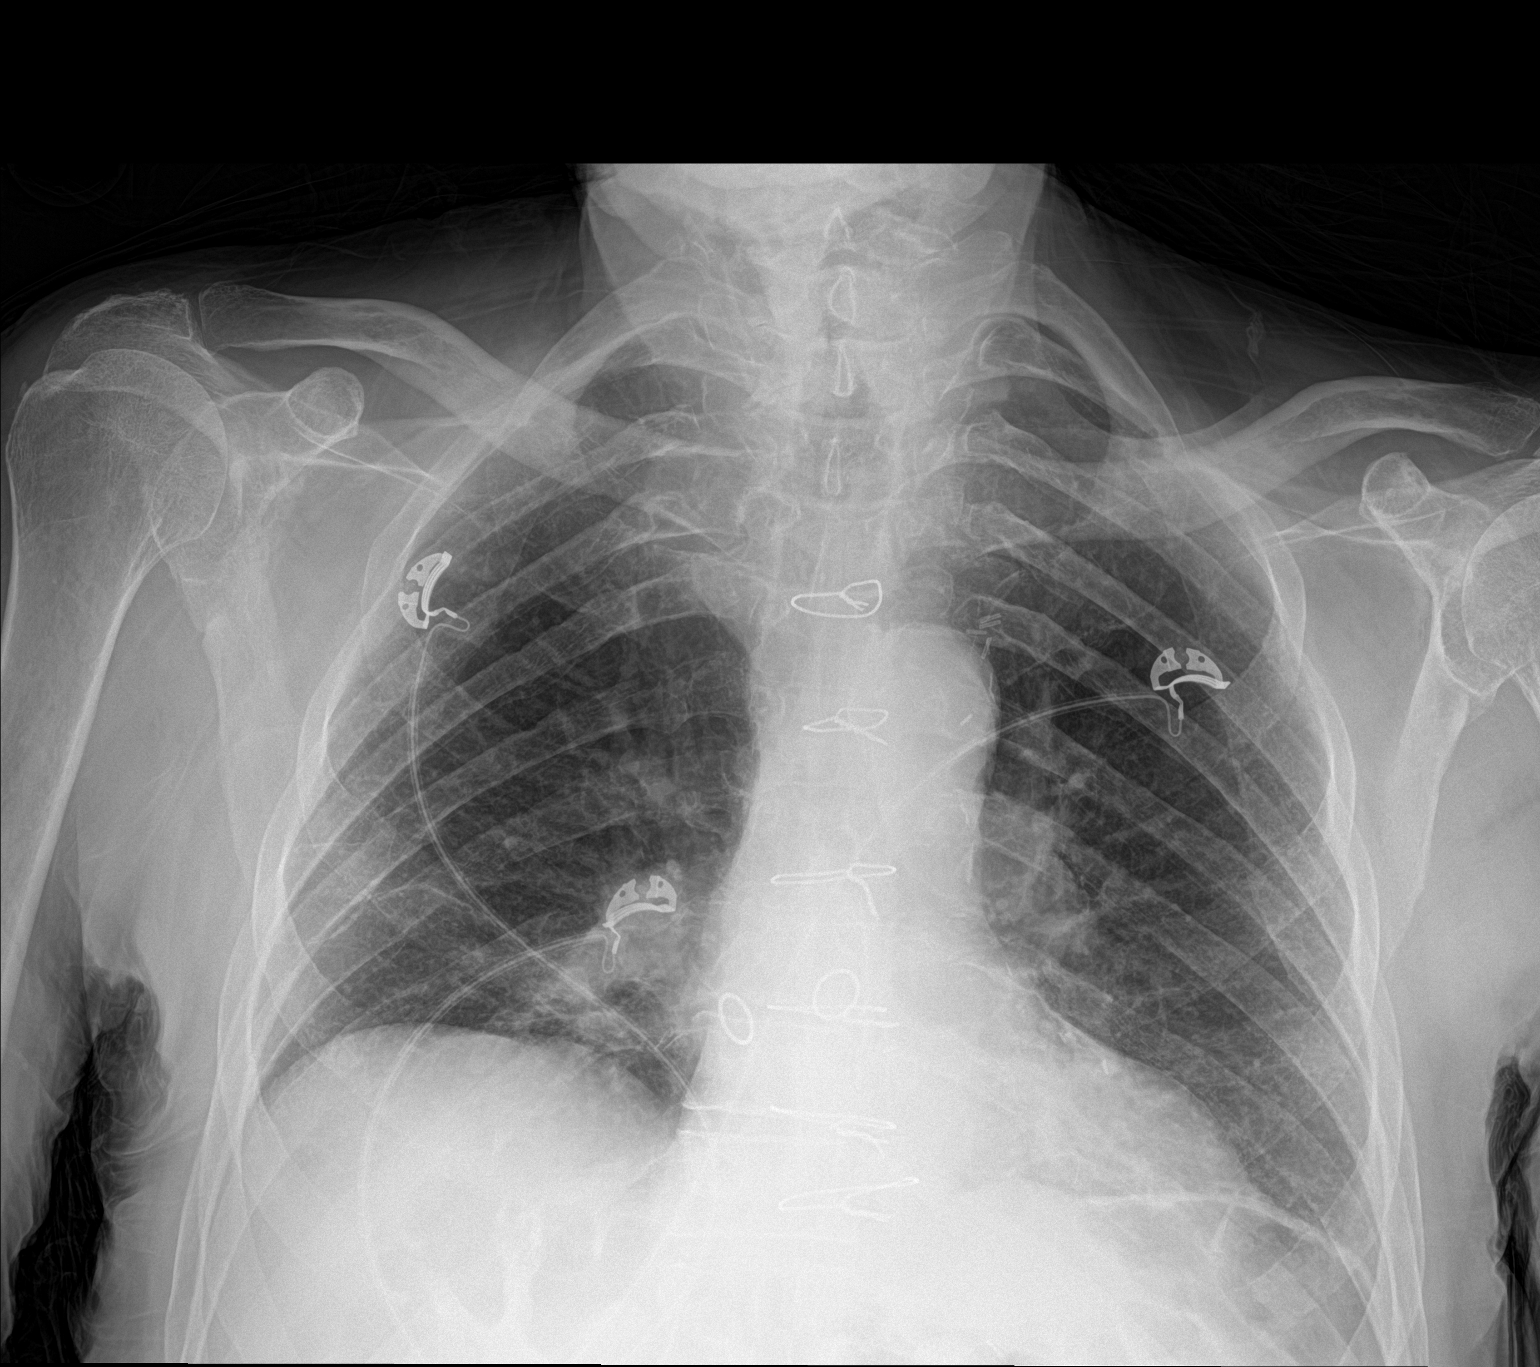

[1 of 1 positions shown; findings below may reference images not displayed]

FINDINGS: Previous median sternotomy and CABG procedure. The heart size and
mediastinal contours are within normal limits. Both lungs are clear.
No pleural effusion or edema. The visualized skeletal structures are
unremarkable.
IMPRESSION: 1. No acute cardiopulmonary abnormalities
